# Patient Record
Sex: Female | Born: 1950 | Race: White | Hispanic: No | Smoking: Current every day smoker
Health system: Southern US, Community
[De-identification: ages and names within clinical notes are randomized; demographics above are authoritative.]

## PROBLEM LIST (undated history)

## (undated) DIAGNOSIS — M1990 Unspecified osteoarthritis, unspecified site: Secondary | ICD-10-CM

## (undated) DIAGNOSIS — K649 Unspecified hemorrhoids: Secondary | ICD-10-CM

## (undated) DIAGNOSIS — K219 Gastro-esophageal reflux disease without esophagitis: Secondary | ICD-10-CM

## (undated) DIAGNOSIS — B029 Zoster without complications: Secondary | ICD-10-CM

## (undated) DIAGNOSIS — M810 Age-related osteoporosis without current pathological fracture: Secondary | ICD-10-CM

## (undated) DIAGNOSIS — Z972 Presence of dental prosthetic device (complete) (partial): Secondary | ICD-10-CM

## (undated) DIAGNOSIS — L309 Dermatitis, unspecified: Secondary | ICD-10-CM

## (undated) DIAGNOSIS — F329 Major depressive disorder, single episode, unspecified: Secondary | ICD-10-CM

## (undated) DIAGNOSIS — F32A Depression, unspecified: Secondary | ICD-10-CM

## (undated) HISTORY — PX: THROAT SURGERY: SHX803

## (undated) HISTORY — PX: ABDOMINAL HYSTERECTOMY: SHX81

## (undated) HISTORY — PX: CHOLECYSTECTOMY: SHX55

## (undated) HISTORY — PX: KNEE SURGERY: SHX244

## (undated) HISTORY — PX: OTHER SURGICAL HISTORY: SHX169

## (undated) HISTORY — PX: TONSILLECTOMY: SUR1361

## (undated) HISTORY — PX: JOINT REPLACEMENT: SHX530

---

## 1979-01-19 HISTORY — PX: ABDOMINAL HYSTERECTOMY: SHX81

## 2006-07-27 ENCOUNTER — Ambulatory Visit: Payer: Self-pay

## 2007-08-29 ENCOUNTER — Ambulatory Visit: Payer: Self-pay

## 2007-08-31 ENCOUNTER — Ambulatory Visit: Payer: Self-pay

## 2008-03-06 ENCOUNTER — Ambulatory Visit: Payer: Self-pay

## 2013-07-27 ENCOUNTER — Ambulatory Visit: Payer: Self-pay | Admitting: Unknown Physician Specialty

## 2014-04-30 ENCOUNTER — Ambulatory Visit
Admit: 2014-04-30 | Disposition: A | Discharge status: Home / Self Care | Payer: Self-pay | Attending: Physician Assistant | Admitting: Physician Assistant

## 2014-09-13 ENCOUNTER — Other Ambulatory Visit: Payer: Self-pay | Admitting: Internal Medicine

## 2014-09-13 DIAGNOSIS — Z1231 Encounter for screening mammogram for malignant neoplasm of breast: Secondary | ICD-10-CM

## 2014-09-26 ENCOUNTER — Ambulatory Visit: Payer: Self-pay

## 2014-11-14 ENCOUNTER — Encounter: Payer: Self-pay | Admitting: *Deleted

## 2014-11-15 ENCOUNTER — Ambulatory Visit: Payer: No Typology Code available for payment source | Admitting: Certified Registered Nurse Anesthetist

## 2014-11-15 ENCOUNTER — Encounter: Payer: Self-pay | Admitting: *Deleted

## 2014-11-15 ENCOUNTER — Encounter
Admission: RE | Disposition: A | Discharge status: Home / Self Care | Payer: Self-pay | Source: Ambulatory Visit | Attending: Gastroenterology

## 2014-11-15 ENCOUNTER — Ambulatory Visit
Admission: RE | Admit: 2014-11-15 | Discharge: 2014-11-15 | Disposition: A | Discharge status: Home / Self Care | Payer: No Typology Code available for payment source | Source: Ambulatory Visit | Attending: Gastroenterology | Admitting: Gastroenterology

## 2014-11-15 DIAGNOSIS — Z79899 Other long term (current) drug therapy: Secondary | ICD-10-CM | POA: Diagnosis not present

## 2014-11-15 DIAGNOSIS — D120 Benign neoplasm of cecum: Secondary | ICD-10-CM | POA: Diagnosis not present

## 2014-11-15 DIAGNOSIS — D123 Benign neoplasm of transverse colon: Secondary | ICD-10-CM | POA: Diagnosis not present

## 2014-11-15 DIAGNOSIS — Z96651 Presence of right artificial knee joint: Secondary | ICD-10-CM | POA: Insufficient documentation

## 2014-11-15 DIAGNOSIS — F329 Major depressive disorder, single episode, unspecified: Secondary | ICD-10-CM | POA: Diagnosis not present

## 2014-11-15 DIAGNOSIS — M1990 Unspecified osteoarthritis, unspecified site: Secondary | ICD-10-CM | POA: Diagnosis not present

## 2014-11-15 DIAGNOSIS — Z9049 Acquired absence of other specified parts of digestive tract: Secondary | ICD-10-CM | POA: Diagnosis not present

## 2014-11-15 DIAGNOSIS — Z90710 Acquired absence of both cervix and uterus: Secondary | ICD-10-CM | POA: Diagnosis not present

## 2014-11-15 DIAGNOSIS — Z8249 Family history of ischemic heart disease and other diseases of the circulatory system: Secondary | ICD-10-CM | POA: Diagnosis not present

## 2014-11-15 DIAGNOSIS — Z823 Family history of stroke: Secondary | ICD-10-CM | POA: Diagnosis not present

## 2014-11-15 DIAGNOSIS — K644 Residual hemorrhoidal skin tags: Secondary | ICD-10-CM | POA: Diagnosis not present

## 2014-11-15 DIAGNOSIS — F17210 Nicotine dependence, cigarettes, uncomplicated: Secondary | ICD-10-CM | POA: Insufficient documentation

## 2014-11-15 DIAGNOSIS — Z833 Family history of diabetes mellitus: Secondary | ICD-10-CM | POA: Diagnosis not present

## 2014-11-15 DIAGNOSIS — D125 Benign neoplasm of sigmoid colon: Secondary | ICD-10-CM | POA: Insufficient documentation

## 2014-11-15 DIAGNOSIS — Z8371 Family history of colonic polyps: Secondary | ICD-10-CM | POA: Insufficient documentation

## 2014-11-15 DIAGNOSIS — K5730 Diverticulosis of large intestine without perforation or abscess without bleeding: Secondary | ICD-10-CM | POA: Diagnosis not present

## 2014-11-15 DIAGNOSIS — Z86010 Personal history of colonic polyps: Secondary | ICD-10-CM | POA: Insufficient documentation

## 2014-11-15 HISTORY — DX: Zoster without complications: B02.9

## 2014-11-15 HISTORY — DX: Unspecified osteoarthritis, unspecified site: M19.90

## 2014-11-15 HISTORY — DX: Depression, unspecified: F32.A

## 2014-11-15 HISTORY — DX: Unspecified hemorrhoids: K64.9

## 2014-11-15 HISTORY — DX: Dermatitis, unspecified: L30.9

## 2014-11-15 HISTORY — DX: Major depressive disorder, single episode, unspecified: F32.9

## 2014-11-15 HISTORY — PX: COLONOSCOPY WITH PROPOFOL: SHX5780

## 2014-11-15 SURGERY — COLONOSCOPY WITH PROPOFOL
Anesthesia: General

## 2014-11-15 MED ORDER — PROPOFOL 10 MG/ML IV BOLUS
INTRAVENOUS | Status: DC | PRN
  Administered 2014-11-15: 20 mg via INTRAVENOUS
  Administered 2014-11-15: 50 mg via INTRAVENOUS
  Administered 2014-11-15: 20 mg via INTRAVENOUS

## 2014-11-15 MED ORDER — SODIUM CHLORIDE 0.9 % IV SOLN
INTRAVENOUS | Status: DC
  Administered 2014-11-15: 11:00:00 via INTRAVENOUS

## 2014-11-15 MED ORDER — PROPOFOL 500 MG/50ML IV EMUL
INTRAVENOUS | Status: DC | PRN
  Administered 2014-11-15: 140 ug/kg/min via INTRAVENOUS

## 2014-11-15 MED ORDER — LIDOCAINE HCL (CARDIAC) 20 MG/ML IV SOLN
INTRAVENOUS | Status: DC | PRN
  Administered 2014-11-15: 40 mg via INTRAVENOUS

## 2014-11-15 NOTE — Anesthesia Postprocedure Evaluation (Signed)
  Anesthesia Post-op Note  Patient: Heidi Barrera  Procedure(s) Performed: Procedure(s): COLONOSCOPY WITH PROPOFOL (N/A)  Anesthesia type:General  Patient location: PACU  Post pain: Pain level controlled  Post assessment: Post-op Vital signs reviewed, Patient's Cardiovascular Status Stable, Respiratory Function Stable, Patent Airway and No signs of Nausea or vomiting  Post vital signs: Reviewed and stable  Last Vitals:  Filed Vitals:   11/15/14 1300  BP: 127/65  Pulse: 72  Temp:   Resp: 22    Level of consciousness: awake, alert  and patient cooperative  Complications: No apparent anesthesia complications

## 2014-11-15 NOTE — Anesthesia Preprocedure Evaluation (Signed)
Anesthesia Evaluation  Patient identified by MRN, date of birth, ID band Patient awake    Reviewed: Allergy & Precautions, H&P , NPO status , Patient's Chart, lab work & pertinent test results  History of Anesthesia Complications Negative for: history of anesthetic complications  Airway Mallampati: II  TM Distance: >3 FB Neck ROM: full    Dental  (+) Poor Dentition, Missing, Upper Dentures   Pulmonary neg shortness of breath, COPD, Current Smoker,    Pulmonary exam normal breath sounds clear to auscultation       Cardiovascular Exercise Tolerance: Good (-) angina(-) Past MI and (-) DOE negative cardio ROS Normal cardiovascular exam Rhythm:regular Rate:Normal     Neuro/Psych PSYCHIATRIC DISORDERS Depression negative neurological ROS     GI/Hepatic negative GI ROS, Neg liver ROS,   Endo/Other  negative endocrine ROS  Renal/GU negative Renal ROS  negative genitourinary   Musculoskeletal  (+) Arthritis ,   Abdominal   Peds  Hematology negative hematology ROS (+)   Anesthesia Other Findings Past Medical History:   Eczema                                                       Shingles                                                     Depression                                                   Arthritis                                                    Hemorrhoids                                                 Past Surgical History:   TONSILLECTOMY                                                 CHOLECYSTECTOMY                                               ABDOMINAL HYSTERECTOMY                                        KNEE SURGERY  THROAT SURGERY                                                Excision of Loose Body Knee                     Right              JOINT REPLACEMENT                                               Comment:RT TKR  BMI    Body Mass Index    21.53 kg/m 2      Reproductive/Obstetrics negative OB ROS                             Anesthesia Physical Anesthesia Plan  ASA: III  Anesthesia Plan: General   Post-op Pain Management:    Induction:   Airway Management Planned:   Additional Equipment:   Intra-op Plan:   Post-operative Plan:   Informed Consent: I have reviewed the patients History and Physical, chart, labs and discussed the procedure including the risks, benefits and alternatives for the proposed anesthesia with the patient or authorized representative who has indicated his/her understanding and acceptance.   Dental Advisory Given  Plan Discussed with: Anesthesiologist, CRNA and Surgeon  Anesthesia Plan Comments:         Anesthesia Quick Evaluation

## 2014-11-15 NOTE — Anesthesia Procedure Notes (Signed)
Performed by: Lela Gell Pre-anesthesia Checklist: Patient identified, Emergency Drugs available, Suction available, Timeout performed and Patient being monitored Patient Re-evaluated:Patient Re-evaluated prior to inductionOxygen Delivery Method: Nasal cannula Intubation Type: IV induction       

## 2014-11-15 NOTE — Transfer of Care (Signed)
Immediate Anesthesia Transfer of Care Note  Patient: Heidi Barrera  Procedure(s) Performed: Procedure(s): COLONOSCOPY WITH PROPOFOL (N/A)  Patient Location: PACU  Anesthesia Type:General  Level of Consciousness: awake, alert  and oriented  Airway & Oxygen Therapy: Patient Spontanous Breathing and Patient connected to nasal cannula oxygen  Post-op Assessment: Report given to RN and Post -op Vital signs reviewed and stable  Post vital signs: Reviewed and stable  Last Vitals:  Filed Vitals:   11/15/14 1116  BP: 118/59  Pulse: 87  Temp: 36.7 C  Resp: 18    Complications: No apparent anesthesia complications

## 2014-11-15 NOTE — H&P (Signed)
  Primary Care Physician:  Adin Hector, MD  Pre-Procedure History & Physical: HPI:  Heidi Barrera is a 64 y.o. female is here for an colonoscopy.   Past Medical History  Diagnosis Date  . Eczema   . Shingles   . Depression   . Arthritis   . Hemorrhoids     Past Surgical History  Procedure Laterality Date  . Tonsillectomy    . Cholecystectomy    . Abdominal hysterectomy    . Knee surgery    . Throat surgery    . Excision of loose body knee Right   . Joint replacement      RT TKR    Prior to Admission medications   Medication Sig Start Date End Date Taking? Authorizing Provider  clonazePAM (KLONOPIN) 0.5 MG tablet Take 0.5 mg by mouth at bedtime. 1/2 to 1 tablet daily as needed for anxiety/sleep   Yes Historical Provider, MD  dicyclomine (BENTYL) 10 MG capsule Take 10 mg by mouth 4 (four) times daily -  before meals and at bedtime.   Yes Historical Provider, MD  ibuprofen (ADVIL,MOTRIN) 200 MG tablet Take 200 mg by mouth every 6 (six) hours as needed.   Yes Historical Provider, MD  sertraline (ZOLOFT) 100 MG tablet Take 100 mg by mouth daily.   Yes Historical Provider, MD    Allergies as of 10/22/2014  . (Not on File)    History reviewed. No pertinent family history.  Social History   Social History  . Marital Status: Widowed    Spouse Name: N/A  . Number of Children: N/A  . Years of Education: N/A   Occupational History  . Not on file.   Social History Main Topics  . Smoking status: Current Every Day Smoker -- 0.50 packs/day for 40 years    Types: Cigarettes  . Smokeless tobacco: Never Used  . Alcohol Use: No  . Drug Use: No  . Sexual Activity: Not on file   Other Topics Concern  . Not on file   Social History Narrative     Physical Exam: BP 118/59 mmHg  Pulse 87  Temp(Src) 98.1 F (36.7 C) (Tympanic)  Resp 18  Ht 4\' 8"  (1.422 m)  Wt 43.545 kg (96 lb)  BMI 21.53 kg/m2  SpO2 99% General:   Alert,  pleasant and cooperative in NAD Head:   Normocephalic and atraumatic. Neck:  Supple; no masses or thyromegaly. Lungs:  Clear throughout to auscultation.    Heart:  Regular rate and rhythm. Abdomen:  Soft, nontender and nondistended. Normal bowel sounds, without guarding, and without rebound.   Neurologic:  Alert and  oriented x4;  grossly normal neurologically.  Impression/Plan: Heidi Barrera is here for an colonoscopy to be performed for personal hx adenomas, last colon 2013  Risks, benefits, limitations, and alternatives regarding  colonoscopy have been reviewed with the patient.  Questions have been answered.  All parties agreeable.   Josefine Class, MD  11/15/2014, 11:44 AM

## 2014-11-15 NOTE — Discharge Instructions (Signed)

## 2014-11-15 NOTE — Op Note (Signed)
[facility] Patient Name: Heidi Barrera Procedure Date: 11/15/2014 11:42 AM MRN: [id number] Account #: [id number] Date of Birth: 05-14-1950 Admit Type: Outpatient Age: 64 Room: [facility] ENDO ROOM 2 Gender: Female Note Status: Finalized Procedure:         Colonoscopy Indications:       Surveillance: Personal history of adenomatous polyps on                     last colonoscopy 3 years ago Patient Profile:   This is a 64 year old female. Providers:         Gerrit Heck. Rayann Heman, MD Referring MD:      Ramonita Lab, MD (Referring MD) Medicines:         Propofol per Anesthesia Complications:     No immediate complications. Procedure:         Pre-Anesthesia Assessment:                    - Prior to the procedure, a History and Physical was                     performed, and patient medications, allergies and                     sensitivities were reviewed. The patient's tolerance of                     previous anesthesia was reviewed.                    After obtaining informed consent, the colonoscope was                     passed under direct vision. Throughout the procedure, the                     patient's blood pressure, pulse, and oxygen saturations                     were monitored continuously. The Colonoscope was                     introduced through the anus and advanced to the the                     terminal ileum. The colonoscopy was performed without                     difficulty. The patient tolerated the procedure well. The                     quality of the bowel preparation was good. Findings:      The perianal exam findings include non-thrombosed external hemorrhoids.      A 4 mm polyp was found at the hepatic flexure. The polyp was sessile.       The polyp was removed with a cold snare. Resection and retrieval were       complete.      A 3 mm polyp was found in the cecum. The polyp was sessile. The polyp       was removed with a jumbo  cold forceps. Resection and retrieval were       complete.      A 3 mm polyp was found in the transverse colon. The polyp was sessile.  The polyp was removed with a jumbo cold forceps. Resection and retrieval       were complete.      A 3 mm polyp was found in the sigmoid colon. The polyp was sessile. The       polyp was removed with a jumbo cold forceps. Resection and retrieval       were complete.      A few small-mouthed diverticula were found in the sigmoid colon.      Internal hemorrhoids were found during retroflexion. The hemorrhoids       were Grade I (internal hemorrhoids that do not prolapse).      The exam was otherwise without abnormality. Impression:        - Non-thrombosed external hemorrhoids found on perianal                     exam.                    - One 4 mm polyp at the hepatic flexure. Resected and                     retrieved.                    - One 3 mm polyp in the cecum. Resected and retrieved.                    - One 3 mm polyp in the transverse colon. Resected and                     retrieved.                    - One 3 mm polyp in the sigmoid colon. Resected and                     retrieved.                    - Diverticulosis in the sigmoid colon.                    - Internal hemorrhoids.                    - The examination was otherwise normal. Recommendation:    - Observe patient in GI recovery unit.                    - High fiber diet.                    - Continue present medications.                    - Await pathology results.                    - Repeat colonoscopy for surveillance based on pathology                     results, likely in 3 years                    - Return to referring physician.                    - The findings and recommendations were discussed with the  patient.                    - The findings and recommendations were discussed with the                     patient's family. Procedure  Code(s): --- Professional ---                    [phone], Colonoscopy, flexible; with removal of tumor(s),                     polyp(s), or other lesion(s) by snare technique                    75916, 86, Colonoscopy, flexible; with biopsy, single or                     multiple CPT copyright 2014 American Medical Association. All rights reserved. The codes documented in this report are preliminary and upon coder review may  be revised to meet current compliance requirements. Mellody Life, MD 11/15/2014 12:23:56 PM This report has been signed electronically. Number of Addenda: 0 Note Initiated On: 11/15/2014 11:42 AM Scope Withdrawal Time: 0 hours 16 minutes 17 seconds  Total Procedure Duration: 0 hours 22 minutes 55 seconds       [facility]

## 2014-11-17 ENCOUNTER — Encounter: Payer: Self-pay | Admitting: Gastroenterology

## 2014-11-18 LAB — SURGICAL PATHOLOGY

## 2015-04-23 ENCOUNTER — Ambulatory Visit
Admission: RE | Admit: 2015-04-23 | Discharge: 2015-04-23 | Disposition: A | Discharge status: Home / Self Care | Payer: BLUE CROSS/BLUE SHIELD | Source: Ambulatory Visit | Attending: Family Medicine | Admitting: Family Medicine

## 2015-04-23 ENCOUNTER — Other Ambulatory Visit: Payer: Self-pay | Admitting: Family Medicine

## 2015-04-23 ENCOUNTER — Encounter: Payer: Self-pay | Admitting: Family Medicine

## 2015-04-23 ENCOUNTER — Inpatient Hospital Stay: Payer: BLUE CROSS/BLUE SHIELD | Attending: Family Medicine | Admitting: Family Medicine

## 2015-04-23 DIAGNOSIS — F17210 Nicotine dependence, cigarettes, uncomplicated: Secondary | ICD-10-CM | POA: Diagnosis not present

## 2015-04-23 DIAGNOSIS — Z87891 Personal history of nicotine dependence: Secondary | ICD-10-CM | POA: Diagnosis present

## 2015-04-23 DIAGNOSIS — Z122 Encounter for screening for malignant neoplasm of respiratory organs: Secondary | ICD-10-CM

## 2015-04-23 DIAGNOSIS — R932 Abnormal findings on diagnostic imaging of liver and biliary tract: Secondary | ICD-10-CM | POA: Diagnosis not present

## 2015-04-23 DIAGNOSIS — I700 Atherosclerosis of aorta: Secondary | ICD-10-CM | POA: Insufficient documentation

## 2015-04-23 DIAGNOSIS — J439 Emphysema, unspecified: Secondary | ICD-10-CM | POA: Insufficient documentation

## 2015-04-23 DIAGNOSIS — R918 Other nonspecific abnormal finding of lung field: Secondary | ICD-10-CM | POA: Insufficient documentation

## 2015-04-23 NOTE — Progress Notes (Signed)
In accordance with CMS guidelines, patient has meet eligibility criteria including age, absence of signs or symptoms of lung cancer, the specific calculation of cigarette smoking pack-years was 37.5 years and is a current smoker.   A shared decision-making session was conducted prior to the performance of CT scan. This includes one or more decision aids, includes benefits and harms of screening, follow-up diagnostic testing, over-diagnosis, false positive rate, and total radiation exposure.  Counseling on the importance of adherence to annual lung cancer LDCT screening, impact of co-morbidities, and ability or willingness to undergo diagnosis and treatment is imperative for compliance of the program.  Counseling on the importance of continued smoking cessation for former smokers; the importance of smoking cessation for current smokers and information about tobacco cessation interventions have been given to patient including the [place] at [facility], [address] quit [place], as well as [place] specific smoking cessation programs.  Written order for lung cancer screening with LDCT has been given to the patient and any and all questions have been answered to the best of my abilities.   Yearly follow up will be scheduled by Burgess Estelle, Thoracic Navigator.

## 2015-04-25 ENCOUNTER — Telehealth: Payer: Self-pay | Admitting: *Deleted

## 2015-04-25 NOTE — Telephone Encounter (Signed)
Notified patient of LDCT lung cancer screening results of Lung Rads 3S finding with recommendation for 12 month follow up imaging. Also notified of incidental finding noted below. Patient verbalizes understanding. Will forward to PCP and assist with follow up of liver finding as needed.  IMPRESSION: 1. Lung-Rads category 3s, probably benign findings. Short-term follow-up in 6 months is recommended with repeat low-dose chest CT without contrast (please use the following order, "CT CHEST LCS NODULE FOLLOW-UP W/O CM"). 2. The S modifier refers to a potentially clinically significant finding within the liver. There is an indeterminate, intermediate attenuating structure within the posterior dome measuring 5.6 cm. Further investigation with liver protocol MRI is advised. 3. Emphysema 4. Aortic atherosclerosis.

## 2015-05-08 ENCOUNTER — Other Ambulatory Visit: Payer: Self-pay | Admitting: Internal Medicine

## 2015-05-08 DIAGNOSIS — R160 Hepatomegaly, not elsewhere classified: Secondary | ICD-10-CM

## 2015-05-27 ENCOUNTER — Ambulatory Visit
Admission: RE | Admit: 2015-05-27 | Discharge: 2015-05-27 | Disposition: A | Discharge status: Home / Self Care | Payer: BLUE CROSS/BLUE SHIELD | Source: Ambulatory Visit | Attending: Internal Medicine | Admitting: Internal Medicine

## 2015-05-27 DIAGNOSIS — K7689 Other specified diseases of liver: Secondary | ICD-10-CM | POA: Diagnosis not present

## 2015-05-27 DIAGNOSIS — R160 Hepatomegaly, not elsewhere classified: Secondary | ICD-10-CM | POA: Insufficient documentation

## 2015-05-27 DIAGNOSIS — Z9049 Acquired absence of other specified parts of digestive tract: Secondary | ICD-10-CM | POA: Diagnosis not present

## 2015-05-27 DIAGNOSIS — D1803 Hemangioma of intra-abdominal structures: Secondary | ICD-10-CM | POA: Insufficient documentation

## 2015-05-27 MED ORDER — GADOBENATE DIMEGLUMINE 529 MG/ML IV SOLN
10.0000 mL | Freq: Once | INTRAVENOUS | Status: AC | PRN
  Administered 2015-05-27: 8 mL via INTRAVENOUS

## 2015-09-10 ENCOUNTER — Other Ambulatory Visit: Payer: Self-pay | Admitting: Internal Medicine

## 2015-09-10 DIAGNOSIS — R911 Solitary pulmonary nodule: Secondary | ICD-10-CM

## 2015-10-24 ENCOUNTER — Ambulatory Visit: Admission: RE | Admit: 2015-10-24 | Payer: BLUE CROSS/BLUE SHIELD | Source: Ambulatory Visit

## 2015-10-24 ENCOUNTER — Ambulatory Visit
Admission: RE | Admit: 2015-10-24 | Discharge: 2015-10-24 | Disposition: A | Discharge status: Home / Self Care | Payer: BLUE CROSS/BLUE SHIELD | Source: Ambulatory Visit | Attending: Internal Medicine | Admitting: Internal Medicine

## 2015-10-24 DIAGNOSIS — R911 Solitary pulmonary nodule: Secondary | ICD-10-CM

## 2015-10-24 DIAGNOSIS — I700 Atherosclerosis of aorta: Secondary | ICD-10-CM | POA: Diagnosis not present

## 2015-10-24 DIAGNOSIS — K769 Liver disease, unspecified: Secondary | ICD-10-CM | POA: Diagnosis not present

## 2015-10-29 ENCOUNTER — Telehealth: Payer: Self-pay | Admitting: *Deleted

## 2015-10-29 NOTE — Telephone Encounter (Signed)
Patient contacted me to review lung screening CT scan. Notified patient of LDCT lung cancer screening results with recommendation for 12 month follow up imaging. Also notified of incidental finding noted below. Patient verbalizes understanding.   IMPRESSION: 1. Lung-RADS Category 2, benign appearance or behavior. Continue annual screening with low-dose chest CT without contrast in 12 months. 2.  Aortic atherosclerosis (ICD10-170.0). 3. Liver lesions, characterized as benign on 05/27/2015.

## 2016-10-25 ENCOUNTER — Telehealth: Payer: Self-pay | Admitting: *Deleted

## 2016-10-25 DIAGNOSIS — Z122 Encounter for screening for malignant neoplasm of respiratory organs: Secondary | ICD-10-CM

## 2016-10-25 DIAGNOSIS — Z87891 Personal history of nicotine dependence: Secondary | ICD-10-CM

## 2016-10-25 NOTE — Telephone Encounter (Signed)
Notified patient that annual lung cancer screening low dose CT scan is due currently or will be in near future. Confirmed that patient is within the age range of 55-77, and asymptomatic, (no signs or symptoms of lung cancer). Patient denies illness that would prevent curative treatment for lung cancer if found. Verified smoking history, (current, 38 pack year). The shared decision making visit was done 04/23/15. Patient is agreeable for CT scan being scheduled.

## 2016-11-05 ENCOUNTER — Ambulatory Visit
Admission: RE | Admit: 2016-11-05 | Discharge: 2016-11-05 | Disposition: A | Discharge status: Home / Self Care | Payer: Medicare Other | Source: Ambulatory Visit | Attending: Nurse Practitioner | Admitting: Nurse Practitioner

## 2016-11-05 DIAGNOSIS — Z87891 Personal history of nicotine dependence: Secondary | ICD-10-CM | POA: Diagnosis not present

## 2016-11-05 DIAGNOSIS — R918 Other nonspecific abnormal finding of lung field: Secondary | ICD-10-CM | POA: Insufficient documentation

## 2016-11-05 DIAGNOSIS — I700 Atherosclerosis of aorta: Secondary | ICD-10-CM | POA: Diagnosis not present

## 2016-11-05 DIAGNOSIS — J432 Centrilobular emphysema: Secondary | ICD-10-CM | POA: Diagnosis not present

## 2016-11-05 DIAGNOSIS — D1803 Hemangioma of intra-abdominal structures: Secondary | ICD-10-CM | POA: Diagnosis not present

## 2016-11-05 DIAGNOSIS — Z122 Encounter for screening for malignant neoplasm of respiratory organs: Secondary | ICD-10-CM | POA: Diagnosis present

## 2016-11-08 ENCOUNTER — Encounter: Payer: Self-pay | Admitting: *Deleted

## 2017-10-29 ENCOUNTER — Telehealth: Payer: Self-pay

## 2017-10-29 NOTE — Telephone Encounter (Signed)
Call pt regarding lung screening. Pt is a current smoker , smoking about 2 pack per day. Scan can be any day except Oct. 28.  Would like scan to be in the [facility]

## 2017-10-31 ENCOUNTER — Telehealth: Payer: Self-pay | Admitting: *Deleted

## 2017-10-31 ENCOUNTER — Encounter: Payer: Self-pay | Admitting: *Deleted

## 2017-10-31 DIAGNOSIS — Z122 Encounter for screening for malignant neoplasm of respiratory organs: Secondary | ICD-10-CM

## 2017-10-31 NOTE — Telephone Encounter (Signed)
Patient has been notified that the annual lung cancer screening low dose CT scan is due currently or will be in the near future.  Confirmed that the patient is within the age range of 24-80, and asymptomatic, and currently exhibits no signs or symptoms of lung cancer.  Patient denies illness that would prevent curative treatment for lung cancer if found.  Verified smoking history, current smoker 0.5 ppd with 38pkyr history .  The shared decision making visit was completed on [id number] .  Patient is agreeable for the CT scan to be scheduled.  Will call patient back with date and time of appointment.

## 2017-11-08 ENCOUNTER — Telehealth: Payer: Self-pay | Admitting: *Deleted

## 2017-11-08 NOTE — Telephone Encounter (Signed)
Patient called back to verify her appt time, confirmed with patient.

## 2017-11-08 NOTE — Telephone Encounter (Signed)
Called patient to inform of ldct screening appt on 11-10-17@0805 , message left for patient unable to reach at this time.

## 2017-11-10 ENCOUNTER — Ambulatory Visit: Admission: RE | Admit: 2017-11-10 | Payer: Medicare Other | Source: Ambulatory Visit

## 2017-11-10 ENCOUNTER — Ambulatory Visit
Admission: RE | Admit: 2017-11-10 | Discharge: 2017-11-10 | Disposition: A | Discharge status: Home / Self Care | Payer: Medicare Other | Source: Ambulatory Visit | Attending: Oncology | Admitting: Oncology

## 2017-11-10 DIAGNOSIS — Z87891 Personal history of nicotine dependence: Secondary | ICD-10-CM | POA: Insufficient documentation

## 2017-11-10 DIAGNOSIS — I700 Atherosclerosis of aorta: Secondary | ICD-10-CM | POA: Insufficient documentation

## 2017-11-10 DIAGNOSIS — Z122 Encounter for screening for malignant neoplasm of respiratory organs: Secondary | ICD-10-CM | POA: Diagnosis present

## 2017-11-10 DIAGNOSIS — J439 Emphysema, unspecified: Secondary | ICD-10-CM | POA: Insufficient documentation

## 2017-11-11 ENCOUNTER — Encounter: Payer: Self-pay | Admitting: *Deleted

## 2018-03-15 ENCOUNTER — Other Ambulatory Visit: Payer: Self-pay

## 2018-03-15 ENCOUNTER — Encounter
Admission: RE | Admit: 2018-03-15 | Discharge: 2018-03-15 | Disposition: A | Discharge status: Home / Self Care | Payer: Medicare Other | Source: Ambulatory Visit | Attending: Surgery | Admitting: Surgery

## 2018-03-15 DIAGNOSIS — Z01818 Encounter for other preprocedural examination: Secondary | ICD-10-CM | POA: Insufficient documentation

## 2018-03-15 HISTORY — DX: Gastro-esophageal reflux disease without esophagitis: K21.9

## 2018-03-15 LAB — BASIC METABOLIC PANEL
Anion gap: 7 (ref 5–15)
BUN: 8 mg/dL (ref 8–23)
CO2: 27 mmol/L (ref 22–32)
Calcium: 9.3 mg/dL (ref 8.9–10.3)
Chloride: 106 mmol/L (ref 98–111)
Creatinine, Ser: 0.58 mg/dL (ref 0.44–1.00)
GFR calc Af Amer: >60 mL/min (ref >60)
GFR calc non Af Amer: >60 mL/min (ref >60)
Glucose, Bld: 97 mg/dL (ref 70–99)
Potassium: 3.9 mmol/L (ref 3.5–5.1)
Sodium: 140 mmol/L (ref 135–145)

## 2018-03-15 LAB — URINALYSIS, ROUTINE W REFLEX MICROSCOPIC
Bilirubin Urine: NEGATIVE
Glucose, UA: NEGATIVE mg/dL
Ketones, ur: NEGATIVE mg/dL
Leukocytes,Ua: NEGATIVE
Nitrite: NEGATIVE
Protein, ur: NEGATIVE mg/dL
Specific Gravity, Urine: 1.002 — ABNORMAL LOW (ref 1.005–1.030)
pH: 7 (ref 5.0–8.0)

## 2018-03-15 LAB — CBC
HEMATOCRIT: 41.9 % (ref 36.0–46.0)
Hemoglobin: 13.8 g/dL (ref 12.0–15.0)
MCH: 32.9 pg (ref 26.0–34.0)
MCHC: 32.9 g/dL (ref 30.0–36.0)
MCV: 99.8 fL (ref 80.0–100.0)
Platelets: 230 10*3/uL (ref 150–400)
RBC: 4.2 MIL/uL (ref 3.87–5.11)
RDW: 12.4 % (ref 11.5–15.5)
WBC: 7.2 10*3/uL (ref 4.0–10.5)
nRBC: 0 % (ref 0.0–0.2)

## 2018-03-15 LAB — TYPE AND SCREEN
ABO/RH(D): O POS
Antibody Screen: NEGATIVE

## 2018-03-15 LAB — SURGICAL PCR SCREEN
MRSA, PCR: NEGATIVE
Staphylococcus aureus: NEGATIVE

## 2018-03-15 NOTE — Patient Instructions (Signed)
  Your procedure is scheduled on: Thursday March 23, 2018 Report to Same Day Surgery 2nd floor [facility] Entrance-take elevator on left to 2nd floor.  Check in with surgery information desk.) To find out your arrival time, call [phone] 1:00-3:00 PM on Wednesday March 22, 2018  Remember: Instructions that are not followed completely may result in serious medical risk, up to and including death, or upon the discretion of your surgeon and anesthesiologist your surgery may need to be rescheduled.    __x__ 1. Do not eat food (including mints, candies, chewing gum) after midnight the night before your procedure. You may drink clear liquids up to 2 hours before you are scheduled to arrive at the hospital for your procedure.  Do not drink anything within 2 hours of your scheduled arrival to the hospital.  Approved clear liquids:  --Water or Apple juice without pulp  --Clear carbohydrate beverage such as Gatorade or Powerade  --Black Coffee or Clear Tea (No milk, no creamers, do not add anything to the coffee or tea)    __x__ 2. No Alcohol for 24 hours before or after surgery.   __x__ 3. No Smoking or e-cigarettes for 24 hours before surgery.  Do not use any chewable tobacco products for at least 6 hours before surgery.   __x__ 4. Notify your doctor if there is any change in your medical condition (cold, fever, infections).   __x__ 5. On the morning of surgery brush your teeth with toothpaste and water.  You may rinse your mouth with mouthwash if you wish.  Do not swallow any toothpaste or mouthwash.  Please read over the following fact sheets that you were given:   [facility] Preparing for Surgery and/or MRSA Information    __x__ Use CHG Soap or Sage wipes as directed on instruction sheet   Do not wear jewelry, make-up, hairpins, clips or nail polish on the day of surgery.  Do not wear lotions, powders, deodorant, or perfumes.   Do not shave below the face/neck 48 hours  prior to surgery.   Do not bring valuables to the hospital.    [facility] is not responsible for any belongings or valuables.               Contacts, dentures or bridgework may not be worn into surgery.  Leave your suitcase in the car. After surgery it may be brought to your room.  For patients admitted to the hospital, discharge time is determined by your treatment team.  __x__ Take these medicines on the morning of surgery with a small sip of water:  1. Pantoprazole (Protonix)  2. Sertraline (Zoloft)   Clonazepam and Tylenol if needed  ____ Follow recommendations from Cardiologist, Pulmonologist or PCP regarding stopping Aspirin, Coumadin, Plavix, Eliquis, Effient, Pradaxa, and Pletal.  __x__ TODAY: Stop Anti-inflammatories such as Advil, Ibuprofen, Motrin, Aleve, Naproxen, Naprosyn, BC/Goodies powders or aspirin products. You may continue to take Tylenol and Celebrex.   ____  Avoid supplements until after surgery. You may continue to take Vitamin D, Vitamin B, and multivitamin.

## 2018-03-16 LAB — URINE CULTURE: Culture: NO GROWTH

## 2018-03-16 NOTE — Pre-Procedure Instructions (Signed)
PCP CLEARED CKD/ANEMIA STABLE. LM FOR DR Lacinda Axon RE REPEAT LABS AM SURGERY SINCE CLEARED WITH LABS 03/14/18

## 2018-03-22 MED ORDER — CEFAZOLIN SODIUM-DEXTROSE 2-4 GM/100ML-% IV SOLN
2.0000 g | Freq: Once | INTRAVENOUS | Status: AC
  Administered 2018-03-23: 2 g via INTRAVENOUS

## 2018-03-23 ENCOUNTER — Other Ambulatory Visit: Payer: Self-pay

## 2018-03-23 ENCOUNTER — Inpatient Hospital Stay: Payer: Medicare Other

## 2018-03-23 ENCOUNTER — Inpatient Hospital Stay: Payer: Medicare Other | Admitting: Certified Registered"

## 2018-03-23 ENCOUNTER — Encounter
Admission: RE | Disposition: A | Discharge status: Home Health | Payer: Self-pay | Source: Home / Self Care | Attending: Surgery

## 2018-03-23 ENCOUNTER — Inpatient Hospital Stay
Admission: RE | Admit: 2018-03-23 | Discharge: 2018-03-25 | DRG: 470 | Disposition: A | Discharge status: Home Health | Payer: Medicare Other | Attending: Surgery | Admitting: Surgery

## 2018-03-23 DIAGNOSIS — M25752 Osteophyte, left hip: Secondary | ICD-10-CM | POA: Diagnosis present

## 2018-03-23 DIAGNOSIS — D62 Acute posthemorrhagic anemia: Secondary | ICD-10-CM | POA: Diagnosis not present

## 2018-03-23 DIAGNOSIS — Z823 Family history of stroke: Secondary | ICD-10-CM

## 2018-03-23 DIAGNOSIS — M5116 Intervertebral disc disorders with radiculopathy, lumbar region: Secondary | ICD-10-CM | POA: Diagnosis present

## 2018-03-23 DIAGNOSIS — Z833 Family history of diabetes mellitus: Secondary | ICD-10-CM | POA: Diagnosis not present

## 2018-03-23 DIAGNOSIS — Z79899 Other long term (current) drug therapy: Secondary | ICD-10-CM | POA: Diagnosis not present

## 2018-03-23 DIAGNOSIS — F17210 Nicotine dependence, cigarettes, uncomplicated: Secondary | ICD-10-CM | POA: Diagnosis present

## 2018-03-23 DIAGNOSIS — M25751 Osteophyte, right hip: Secondary | ICD-10-CM | POA: Diagnosis present

## 2018-03-23 DIAGNOSIS — J449 Chronic obstructive pulmonary disease, unspecified: Secondary | ICD-10-CM | POA: Diagnosis present

## 2018-03-23 DIAGNOSIS — K219 Gastro-esophageal reflux disease without esophagitis: Secondary | ICD-10-CM | POA: Diagnosis present

## 2018-03-23 DIAGNOSIS — Z96642 Presence of left artificial hip joint: Secondary | ICD-10-CM

## 2018-03-23 DIAGNOSIS — Z8249 Family history of ischemic heart disease and other diseases of the circulatory system: Secondary | ICD-10-CM | POA: Diagnosis not present

## 2018-03-23 DIAGNOSIS — Z7982 Long term (current) use of aspirin: Secondary | ICD-10-CM

## 2018-03-23 DIAGNOSIS — F329 Major depressive disorder, single episode, unspecified: Secondary | ICD-10-CM | POA: Diagnosis present

## 2018-03-23 DIAGNOSIS — M1612 Unilateral primary osteoarthritis, left hip: Principal | ICD-10-CM | POA: Diagnosis present

## 2018-03-23 DIAGNOSIS — M810 Age-related osteoporosis without current pathological fracture: Secondary | ICD-10-CM | POA: Diagnosis present

## 2018-03-23 DIAGNOSIS — Z85828 Personal history of other malignant neoplasm of skin: Secondary | ICD-10-CM

## 2018-03-23 DIAGNOSIS — M25552 Pain in left hip: Secondary | ICD-10-CM | POA: Diagnosis present

## 2018-03-23 HISTORY — PX: TOTAL HIP ARTHROPLASTY: SHX124

## 2018-03-23 LAB — ABO/RH: ABO/RH(D): O POS

## 2018-03-23 SURGERY — ARTHROPLASTY, HIP, TOTAL,POSTERIOR APPROACH
Anesthesia: Spinal | Laterality: Left

## 2018-03-23 MED ORDER — HYDROMORPHONE HCL 1 MG/ML IJ SOLN: 0.2500 mg | INTRAMUSCULAR | Status: DC | PRN

## 2018-03-23 MED ORDER — ASPIRIN EC 81 MG PO TBEC
81.0000 mg | DELAYED_RELEASE_TABLET | Freq: Every day | ORAL | Status: DC
  Administered 2018-03-24 – 2018-03-25 (×2): 81 mg via ORAL
  Filled 2018-03-23 (×2): qty 1

## 2018-03-23 MED ORDER — TRAMADOL HCL 50 MG PO TABS: 50.0000 mg | ORAL_TABLET | Freq: Four times a day (QID) | ORAL | 0 refills | Status: DC | PRN

## 2018-03-23 MED ORDER — KETOROLAC TROMETHAMINE 15 MG/ML IJ SOLN
INTRAMUSCULAR | Status: AC
  Administered 2018-03-23: 15 mg via INTRAVENOUS
  Filled 2018-03-23: qty 1

## 2018-03-23 MED ORDER — ENOXAPARIN SODIUM 40 MG/0.4ML ~~LOC~~ SOLN: 40.0000 mg | SUBCUTANEOUS | 0 refills | Status: DC

## 2018-03-23 MED ORDER — CEFAZOLIN SODIUM-DEXTROSE 2-4 GM/100ML-% IV SOLN
2.0000 g | Freq: Four times a day (QID) | INTRAVENOUS | Status: AC
  Administered 2018-03-23 – 2018-03-24 (×3): 2 g via INTRAVENOUS
  Filled 2018-03-23 (×3): qty 100

## 2018-03-23 MED ORDER — BISACODYL 10 MG RE SUPP
10.0000 mg | Freq: Every day | RECTAL | Status: DC | PRN
  Administered 2018-03-25: 10 mg via RECTAL
  Filled 2018-03-23: qty 1

## 2018-03-23 MED ORDER — MIDAZOLAM HCL 2 MG/2ML IJ SOLN
INTRAMUSCULAR | Status: AC
  Filled 2018-03-23: qty 2

## 2018-03-23 MED ORDER — CEFAZOLIN SODIUM-DEXTROSE 2-4 GM/100ML-% IV SOLN
INTRAVENOUS | Status: AC
  Filled 2018-03-23: qty 100

## 2018-03-23 MED ORDER — TRAMADOL HCL 50 MG PO TABS
50.0000 mg | ORAL_TABLET | Freq: Four times a day (QID) | ORAL | Status: DC | PRN
  Administered 2018-03-24 (×2): 50 mg via ORAL
  Filled 2018-03-23 (×2): qty 1

## 2018-03-23 MED ORDER — SODIUM CHLORIDE 0.9 % IV SOLN
INTRAVENOUS | Status: DC | PRN
  Administered 2018-03-23: 60 mL

## 2018-03-23 MED ORDER — METOCLOPRAMIDE HCL 10 MG PO TABS: 5.0000 mg | ORAL_TABLET | Freq: Three times a day (TID) | ORAL | Status: DC | PRN

## 2018-03-23 MED ORDER — DIPHENHYDRAMINE HCL 12.5 MG/5ML PO ELIX: 12.5000 mg | ORAL_SOLUTION | ORAL | Status: DC | PRN

## 2018-03-23 MED ORDER — TRANEXAMIC ACID 1000 MG/10ML IV SOLN
INTRAVENOUS | Status: AC
  Filled 2018-03-23: qty 10

## 2018-03-23 MED ORDER — SERTRALINE HCL 50 MG PO TABS
100.0000 mg | ORAL_TABLET | Freq: Every day | ORAL | Status: DC
  Administered 2018-03-23 – 2018-03-25 (×3): 100 mg via ORAL
  Filled 2018-03-23 (×3): qty 2

## 2018-03-23 MED ORDER — MAGNESIUM HYDROXIDE 400 MG/5ML PO SUSP
30.0000 mL | Freq: Every day | ORAL | Status: DC | PRN
  Administered 2018-03-24: 30 mL via ORAL
  Filled 2018-03-23: qty 30

## 2018-03-23 MED ORDER — GLYCOPYRROLATE 0.2 MG/ML IJ SOLN
INTRAMUSCULAR | Status: DC | PRN
  Administered 2018-03-23: 0.2 mg via INTRAVENOUS

## 2018-03-23 MED ORDER — ACETAMINOPHEN 325 MG PO TABS
325.0000 mg | ORAL_TABLET | Freq: Four times a day (QID) | ORAL | Status: DC | PRN
  Administered 2018-03-25: 650 mg via ORAL
  Filled 2018-03-23: qty 2

## 2018-03-23 MED ORDER — ONDANSETRON HCL 4 MG/2ML IJ SOLN
INTRAMUSCULAR | Status: DC | PRN
  Administered 2018-03-23: 4 mg via INTRAVENOUS

## 2018-03-23 MED ORDER — BUPIVACAINE HCL (PF) 0.5 % IJ SOLN
INTRAMUSCULAR | Status: AC
  Filled 2018-03-23: qty 30

## 2018-03-23 MED ORDER — FENTANYL CITRATE (PF) 100 MCG/2ML IJ SOLN
INTRAMUSCULAR | Status: AC
  Filled 2018-03-23: qty 2

## 2018-03-23 MED ORDER — DEXAMETHASONE SODIUM PHOSPHATE 10 MG/ML IJ SOLN
INTRAMUSCULAR | Status: DC | PRN
  Administered 2018-03-23: 10 mg via INTRAVENOUS

## 2018-03-23 MED ORDER — PROPOFOL 500 MG/50ML IV EMUL
INTRAVENOUS | Status: DC | PRN
  Administered 2018-03-23: 75 ug/kg/min via INTRAVENOUS

## 2018-03-23 MED ORDER — ACETAMINOPHEN 500 MG PO TABS
1000.0000 mg | ORAL_TABLET | Freq: Four times a day (QID) | ORAL | Status: AC
  Administered 2018-03-23 – 2018-03-24 (×4): 1000 mg via ORAL
  Filled 2018-03-23 (×4): qty 2

## 2018-03-23 MED ORDER — FAMOTIDINE 20 MG PO TABS
ORAL_TABLET | ORAL | Status: AC
  Administered 2018-03-23: 20 mg via ORAL
  Filled 2018-03-23: qty 1

## 2018-03-23 MED ORDER — BUPIVACAINE LIPOSOME 1.3 % IJ SUSP
INTRAMUSCULAR | Status: AC
  Filled 2018-03-23: qty 20

## 2018-03-23 MED ORDER — MIDAZOLAM HCL 2 MG/2ML IJ SOLN
INTRAMUSCULAR | Status: DC | PRN
  Administered 2018-03-23 (×2): 0.5 mg via INTRAVENOUS
  Administered 2018-03-23: 1 mg via INTRAVENOUS

## 2018-03-23 MED ORDER — SODIUM CHLORIDE FLUSH 0.9 % IV SOLN
INTRAVENOUS | Status: AC
  Filled 2018-03-23: qty 40

## 2018-03-23 MED ORDER — ONDANSETRON HCL 4 MG PO TABS: 4.0000 mg | ORAL_TABLET | Freq: Four times a day (QID) | ORAL | Status: DC | PRN

## 2018-03-23 MED ORDER — EPINEPHRINE PF 1 MG/ML IJ SOLN
INTRAMUSCULAR | Status: AC
  Filled 2018-03-23: qty 1

## 2018-03-23 MED ORDER — FENTANYL CITRATE (PF) 100 MCG/2ML IJ SOLN: 25.0000 ug | INTRAMUSCULAR | Status: DC | PRN

## 2018-03-23 MED ORDER — BUPIVACAINE-EPINEPHRINE (PF) 0.5% -1:200000 IJ SOLN
INTRAMUSCULAR | Status: DC | PRN
  Administered 2018-03-23: 30 mL

## 2018-03-23 MED ORDER — FAMOTIDINE 20 MG PO TABS
20.0000 mg | ORAL_TABLET | Freq: Once | ORAL | Status: AC
  Administered 2018-03-23: 20 mg via ORAL

## 2018-03-23 MED ORDER — ENOXAPARIN SODIUM 40 MG/0.4ML ~~LOC~~ SOLN
40.0000 mg | SUBCUTANEOUS | Status: DC
  Administered 2018-03-24 – 2018-03-25 (×2): 40 mg via SUBCUTANEOUS
  Filled 2018-03-23 (×2): qty 0.4

## 2018-03-23 MED ORDER — KETOROLAC TROMETHAMINE 15 MG/ML IJ SOLN
15.0000 mg | Freq: Once | INTRAMUSCULAR | Status: AC
  Administered 2018-03-23: 15 mg via INTRAVENOUS

## 2018-03-23 MED ORDER — OXYCODONE HCL 5 MG PO TABS: 5.0000 mg | ORAL_TABLET | ORAL | 0 refills | Status: DC | PRN

## 2018-03-23 MED ORDER — CLONAZEPAM 0.5 MG PO TABS
0.2500 mg | ORAL_TABLET | Freq: Every day | ORAL | Status: DC
  Administered 2018-03-23 – 2018-03-24 (×2): 0.5 mg via ORAL
  Filled 2018-03-23 (×2): qty 1

## 2018-03-23 MED ORDER — TRANEXAMIC ACID 1000 MG/10ML IV SOLN
INTRAVENOUS | Status: DC | PRN
  Administered 2018-03-23: 1000 mg via TOPICAL

## 2018-03-23 MED ORDER — ONDANSETRON HCL 4 MG/2ML IJ SOLN: 4.0000 mg | Freq: Once | INTRAMUSCULAR | Status: DC | PRN

## 2018-03-23 MED ORDER — SODIUM CHLORIDE 0.9 % IV SOLN
INTRAVENOUS | Status: DC | PRN
  Administered 2018-03-23: 25 ug/min via INTRAVENOUS

## 2018-03-23 MED ORDER — SODIUM CHLORIDE 0.9 % IV SOLN
INTRAVENOUS | Status: DC
  Administered 2018-03-23: 13:00:00 via INTRAVENOUS

## 2018-03-23 MED ORDER — LACTATED RINGERS IV SOLN
INTRAVENOUS | Status: DC
  Administered 2018-03-23 (×2): via INTRAVENOUS

## 2018-03-23 MED ORDER — DOCUSATE SODIUM 100 MG PO CAPS
100.0000 mg | ORAL_CAPSULE | Freq: Two times a day (BID) | ORAL | Status: DC
  Administered 2018-03-23 – 2018-03-24 (×3): 100 mg via ORAL
  Filled 2018-03-23 (×3): qty 1

## 2018-03-23 MED ORDER — PANTOPRAZOLE SODIUM 20 MG PO TBEC
20.0000 mg | DELAYED_RELEASE_TABLET | Freq: Every day | ORAL | Status: DC
  Administered 2018-03-24 – 2018-03-25 (×2): 20 mg via ORAL
  Filled 2018-03-23 (×3): qty 1

## 2018-03-23 MED ORDER — EPHEDRINE SULFATE 50 MG/ML IJ SOLN
INTRAMUSCULAR | Status: DC | PRN
  Administered 2018-03-23 (×5): 5 mg via INTRAVENOUS

## 2018-03-23 MED ORDER — ONDANSETRON HCL 4 MG/2ML IJ SOLN: 4.0000 mg | Freq: Four times a day (QID) | INTRAMUSCULAR | Status: DC | PRN

## 2018-03-23 MED ORDER — PROPOFOL 10 MG/ML IV BOLUS
INTRAVENOUS | Status: AC
  Filled 2018-03-23: qty 20

## 2018-03-23 MED ORDER — PROPOFOL 500 MG/50ML IV EMUL
INTRAVENOUS | Status: AC
  Filled 2018-03-23: qty 50

## 2018-03-23 MED ORDER — KETOROLAC TROMETHAMINE 15 MG/ML IJ SOLN
7.5000 mg | Freq: Four times a day (QID) | INTRAMUSCULAR | Status: AC
  Administered 2018-03-23 – 2018-03-24 (×3): 7.5 mg via INTRAVENOUS
  Filled 2018-03-23 (×3): qty 1

## 2018-03-23 MED ORDER — FLEET ENEMA 7-19 GM/118ML RE ENEM: 1.0000 | ENEMA | Freq: Once | RECTAL | Status: DC | PRN

## 2018-03-23 MED ORDER — OXYCODONE HCL 5 MG PO TABS
5.0000 mg | ORAL_TABLET | ORAL | Status: DC | PRN
  Administered 2018-03-23 – 2018-03-24 (×2): 5 mg via ORAL
  Filled 2018-03-23: qty 1
  Filled 2018-03-23: qty 2
  Filled 2018-03-23: qty 1

## 2018-03-23 MED ORDER — FENTANYL CITRATE (PF) 100 MCG/2ML IJ SOLN
INTRAMUSCULAR | Status: DC | PRN
  Administered 2018-03-23 (×2): 50 ug via INTRAVENOUS

## 2018-03-23 MED ORDER — METOCLOPRAMIDE HCL 5 MG/ML IJ SOLN: 5.0000 mg | Freq: Three times a day (TID) | INTRAMUSCULAR | Status: DC | PRN

## 2018-03-23 MED ORDER — COLESTIPOL HCL 1 G PO TABS
1.0000 g | ORAL_TABLET | Freq: Two times a day (BID) | ORAL | Status: DC
  Administered 2018-03-23 – 2018-03-24 (×2): 1 g via ORAL
  Filled 2018-03-23 (×6): qty 1

## 2018-03-23 SURGICAL SUPPLY — 57 items
BLADE SAGITTAL WIDE XTHICK NO (BLADE) ×3 IMPLANT
BLADE SURG SZ20 CARB STEEL (BLADE) ×3 IMPLANT
CANISTER SUCT 1200ML W/VALVE (MISCELLANEOUS) ×3 IMPLANT
CANISTER SUCT 3000ML PPV (MISCELLANEOUS) ×6 IMPLANT
CHLORAPREP W/TINT 26ML (MISCELLANEOUS) ×3 IMPLANT
COVER WAND RF STERILE (DRAPES) ×3 IMPLANT
DRAPE IMP U-DRAPE 54X76 (DRAPES) ×3 IMPLANT
DRAPE INCISE IOBAN 66X60 STRL (DRAPES) ×3 IMPLANT
DRAPE SHEET LG 3/4 BI-LAMINATE (DRAPES) ×3 IMPLANT
DRAPE SURG 17X11 SM STRL (DRAPES) ×6 IMPLANT
DRSG OPSITE POSTOP 4X10 (GAUZE/BANDAGES/DRESSINGS) ×3 IMPLANT
ELECT BLADE 6.5 EXT (BLADE) ×3 IMPLANT
ELECT CAUTERY BLADE 6.4 (BLADE) ×3 IMPLANT
GLOVE BIO SURGEON STRL SZ7.5 (GLOVE) ×12 IMPLANT
GLOVE BIO SURGEON STRL SZ8 (GLOVE) ×12 IMPLANT
GLOVE BIOGEL PI IND STRL 8 (GLOVE) ×1 IMPLANT
GLOVE BIOGEL PI INDICATOR 8 (GLOVE) ×2
GLOVE INDICATOR 8.0 STRL GRN (GLOVE) ×3 IMPLANT
GOWN STRL REUS W/ TWL LRG LVL3 (GOWN DISPOSABLE) ×1 IMPLANT
GOWN STRL REUS W/ TWL XL LVL3 (GOWN DISPOSABLE) ×1 IMPLANT
GOWN STRL REUS W/TWL LRG LVL3 (GOWN DISPOSABLE) ×2
GOWN STRL REUS W/TWL XL LVL3 (GOWN DISPOSABLE) ×2
HEAD FEM 32MM DIA 3MM NECK (Head) ×3 IMPLANT
HIP SHELL ACETAB 3H 46MM (Hips) ×3 IMPLANT
HOOD PEEL AWAY FLYTE STAYCOOL (MISCELLANEOUS) ×9 IMPLANT
KIT TURNOVER KIT A (KITS) ×3 IMPLANT
LINER ACETABULAR GY SZ32 B (Liner) ×3 IMPLANT
MAT ABSORB  FLUID 56X50 GRAY (MISCELLANEOUS) ×2
MAT ABSORB FLUID 56X50 GRAY (MISCELLANEOUS) ×1 IMPLANT
NDL SAFETY ECLIPSE 18X1.5 (NEEDLE) ×2 IMPLANT
NEEDLE FILTER BLUNT 18X 1/2SAF (NEEDLE) ×2
NEEDLE FILTER BLUNT 18X1 1/2 (NEEDLE) ×1 IMPLANT
NEEDLE HYPO 18GX1.5 SHARP (NEEDLE) ×4
NEEDLE SPNL 20GX3.5 QUINCKE YW (NEEDLE) ×3 IMPLANT
PACK HIP PROSTHESIS (MISCELLANEOUS) ×3 IMPLANT
PILLOW ABDUCTION FOAM SM (MISCELLANEOUS) ×3 IMPLANT
PIN STEINMAN 3/16 (PIN) ×3 IMPLANT
PIN STEINMANN 3/16X9 BAY 6PK (Pin) ×1 IMPLANT
PULSAVAC PLUS IRRIG FAN TIP (DISPOSABLE) ×3
SHELL ACETAB HIP 3H 46MM (Hips) ×1 IMPLANT
SOL .9 NS 3000ML IRR  AL (IV SOLUTION) ×2
SOL .9 NS 3000ML IRR UROMATIC (IV SOLUTION) ×1 IMPLANT
SPONGE LAP 18X18 RF (DISPOSABLE) ×3 IMPLANT
ST PIN 3/16X9 BAY 6PK (Pin) ×3 IMPLANT
STAPLER SKIN PROX 35W (STAPLE) ×3 IMPLANT
STEM FEM COLLARLESS 8X120X130 (Stem) ×3 IMPLANT
SUT TICRON 2-0 30IN 311381 (SUTURE) ×9 IMPLANT
SUT VIC AB 0 CT1 36 (SUTURE) ×3 IMPLANT
SUT VIC AB 1 CT1 36 (SUTURE) ×6 IMPLANT
SUT VIC AB 2-0 CT1 (SUTURE) ×12 IMPLANT
SYR 10ML LL (SYRINGE) ×3 IMPLANT
SYR 20CC LL (SYRINGE) ×3 IMPLANT
SYR 30ML LL (SYRINGE) ×9 IMPLANT
TAPE TRANSPORE STRL 2 31045 (GAUZE/BANDAGES/DRESSINGS) ×3 IMPLANT
TIP FAN IRRIG PULSAVAC PLUS (DISPOSABLE) ×1 IMPLANT
TRAY FOLEY MTR SLVR 16FR STAT (SET/KITS/TRAYS/PACK) ×3 IMPLANT
WATER STERILE IRR 1000ML POUR (IV SOLUTION) ×3 IMPLANT

## 2018-03-23 NOTE — Evaluation (Signed)
Physical Therapy Evaluation Patient Details Name: Heidi Barrera MRN: [id number] DOB: [birth year] Today's Date: 03/23/2018   History of Present Illness  Pt is a 68 yo female with DJD of the L hip and is s/p elective L THA.  PMH includes COPD, depression, GERD, and R knee surgery.     Clinical Impression  Pt presents with deficits in strength, transfers, mobility, gait, and activity tolerance but overall performed very well during the session especially considering POD#0 status.  Pt was Mod Ind with bed mobility tasks and CGA with transfers with cues for proper sequencing for posterior precaution compliance.  Pt was CGA with amb x 40' with practice making 90 deg turns to the L to avoid CKC L hip IR.  Pt and family present for extensive precaution education. Pt highly motivated to participate with PT services and should make excellent progress while in acute care.  Pt will benefit from HHPT services upon discharge to safely address above deficits for decreased caregiver assistance and eventual return to PLOF.        Follow Up Recommendations Home health PT    Equipment Recommendations  Rolling walker with 5" wheels;Other (comment)(Pt is 4'8", needs a youth FWW)    Recommendations for Other Services       Precautions / Restrictions Precautions Precautions: Posterior Hip;Fall Precaution Booklet Issued: Yes (comment) Precaution Comments: Precaution education and review provided with pt and daughters Restrictions Weight Bearing Restrictions: Yes LLE Weight Bearing: Weight bearing as tolerated      Mobility  Bed Mobility Overal bed mobility: Modified Independent             General bed mobility comments: Extra time and effort but no physical assistance needed with bed mobility tasks  Transfers Overall transfer level: Needs assistance Equipment used: Rolling walker (2 wheeled) Transfers: Sit to/from Stand Sit to Stand: From elevated surface;Min guard         General  transfer comment: Mod verbal cues for sequencing for hip precaution compliance  Ambulation/Gait Ambulation/Gait assistance: Min guard Gait Distance (Feet): 40 Feet Assistive device: Rolling walker (2 wheeled) Gait Pattern/deviations: Step-through pattern;Antalgic;Decreased step length - right;Decreased step length - left Gait velocity: decreased   General Gait Details: Slow cadence with mildly antalgic gait but steady without AD; 90 deg L turns x 4 to practice turns while avoiding closed kinetic chain L hip IR  Stairs            Wheelchair Mobility    Modified Rankin (Stroke Patients Only)       Balance Overall balance assessment: Needs assistance Sitting-balance support: Single extremity supported Sitting balance-Leahy Scale: Good     Standing balance support: Bilateral upper extremity supported Standing balance-Leahy Scale: Good                               Pertinent Vitals/Pain Pain Assessment: 0-10 Pain Score: 5  Pain Location: L hip Pain Descriptors / Indicators: Aching;Sore Pain Intervention(s): Limited activity within patient's tolerance;Premedicated before session;Monitored during session    [place] expects to be discharged to:: Private residence Living Arrangements: Children Available Help at Discharge: Family;Available 24 hours/day Type of Home: Mobile home Home Access: Stairs to enter Entrance Stairs-Rails: Right Entrance Stairs-Number of Steps: 5 Home Layout: One level Home Equipment: Bedside commode;Shower seat      Prior Function Level of Independence: Independent         Comments: Ind amb without AD limited  community distances, one fall in the last 6 months tripping on cord in a home, Ind with ADLs, cleans three houses for extra income     Hand Dominance        Extremity/Trunk Assessment   Upper Extremity Assessment Upper Extremity Assessment: Defer to OT evaluation    Lower Extremity  Assessment Lower Extremity Assessment: Generalized weakness;LLE deficits/detail LLE: Unable to fully assess due to pain LLE Sensation: WNL       Communication   Communication: No difficulties  Cognition Arousal/Alertness: Awake/alert Behavior During Therapy: WFL for tasks assessed/performed Overall Cognitive Status: Within Functional Limits for tasks assessed                                        General Comments      Exercises Total Joint Exercises Ankle Circles/Pumps: Both;10 reps;15 reps Quad Sets: Strengthening;Both;10 reps;15 reps Gluteal Sets: Strengthening;Both;10 reps Long Arc Quad: AROM;Both;10 reps;15 reps Knee Flexion: AROM;Both;10 reps;15 reps Marching in Standing: AROM;Both;10 reps;Standing Other Exercises Other Exercises: HEP education per handout Other Exercises: Posterior hip precaution education with pt and family verbally, handout review, and during bed mobility, transfers, and gait for functional application Other Exercises: Car transfer training sequencing with daughters present using a chair to simulate/demonstrate proper sequencing   Assessment/Plan    PT Assessment Patient needs continued PT services  PT Problem List Decreased strength;Decreased activity tolerance;Decreased mobility;Decreased knowledge of use of DME;Decreased knowledge of precautions       PT Treatment Interventions DME instruction;Gait training;Stair training;Functional mobility training;Therapeutic activities;Therapeutic exercise;Balance training;Patient/family education    PT Goals (Current goals can be found in the Care Plan section)  Acute Rehab PT Goals Patient Stated Goal: To walk and not hurt PT Goal Formulation: With patient Time For Goal Achievement: 04/05/18 Potential to Achieve Goals: Good    Frequency BID   Barriers to discharge        Co-evaluation               AM-PAC PT "6 Clicks" Mobility  Outcome Measure Help needed turning from  your back to your side while in a flat bed without using bedrails?: A Little Help needed moving from lying on your back to sitting on the side of a flat bed without using bedrails?: A Little Help needed moving to and from a bed to a chair (including a wheelchair)?: A Little Help needed standing up from a chair using your arms (e.g., wheelchair or bedside chair)?: A Little Help needed to walk in hospital room?: A Little Help needed climbing 3-5 steps with a railing? : A Little 6 Click Score: 18    End of Session Equipment Utilized During Treatment: Gait belt Activity Tolerance: Patient tolerated treatment well Patient left: in chair;with call bell/phone within reach;with chair alarm set;with family/visitor present;with nursing/sitter in room;with SCD's reapplied Nurse Communication: Mobility status PT Visit Diagnosis: Muscle weakness (generalized) (M62.81);Other abnormalities of gait and mobility (R26.89);Pain Pain - Right/Left: Left Pain - part of body: Hip    Time: 4270-6237 PT Time Calculation (min) (ACUTE ONLY): 54 min   Charges:   PT Evaluation $PT Eval Low Complexity: 1 Low PT Treatments $Gait Training: 8-22 mins $Therapeutic Exercise: 8-22 mins $Therapeutic Activity: 8-22 mins        D. Scott Yukio Bisping PT, DPT 03/23/18, 3:05 PM

## 2018-03-23 NOTE — Transfer of Care (Signed)
Immediate Anesthesia Transfer of Care Note  Patient: Heidi Barrera  Procedure(s) Performed: TOTAL HIP ARTHROPLASTY-LEFT (Left )  Patient Location: Endoscopy Unit  Anesthesia Type:General and Spinal  Level of Consciousness: awake, alert , oriented and patient cooperative  Airway & Oxygen Therapy: Patient Spontanous Breathing and Patient connected to face mask oxygen  Post-op Assessment: Report given to RN and Post -op Vital signs reviewed and stable  Post vital signs: Reviewed and stable  Last Vitals:  Vitals Value Taken Time  BP 122/74 03/23/2018  9:46 AM  Temp    Pulse 88 03/23/2018  9:49 AM  Resp 15 03/23/2018  9:49 AM  SpO2 99 % 03/23/2018  9:49 AM  Vitals shown include unvalidated device data.  Last Pain:  Vitals:   03/23/18 0637  TempSrc: Tympanic  PainSc: 6          Complications: No apparent anesthesia complications

## 2018-03-23 NOTE — Anesthesia Preprocedure Evaluation (Addendum)
Anesthesia Evaluation  Patient identified by MRN, date of birth, ID band Patient awake    Reviewed: Allergy & Precautions, H&P , NPO status , Patient's Chart, lab work & pertinent test results  History of Anesthesia Complications Negative for: history of anesthetic complications  Airway Mallampati: II  TM Distance: >3 FB Neck ROM: full    Dental  (+) Poor Dentition, Missing, Upper Dentures   Pulmonary neg shortness of breath, COPD, Current Smoker,    Pulmonary exam normal breath sounds clear to auscultation       Cardiovascular Exercise Tolerance: Good (-) angina(-) Past MI and (-) DOE negative cardio ROS Normal cardiovascular exam Rhythm:regular Rate:Normal     Neuro/Psych PSYCHIATRIC DISORDERS Depression negative neurological ROS     GI/Hepatic Neg liver ROS, GERD  ,  Endo/Other  negative endocrine ROS  Renal/GU negative Renal ROS  negative genitourinary   Musculoskeletal  (+) Arthritis ,   Abdominal   Peds  Hematology negative hematology ROS (+)   Anesthesia Other Findings Past Medical History:   Eczema                                                       Shingles                                                     Depression                                                   Arthritis                                                    Hemorrhoids                                                 Past Surgical History:   TONSILLECTOMY                                                 CHOLECYSTECTOMY                                               ABDOMINAL HYSTERECTOMY                                        KNEE SURGERY  THROAT SURGERY                                                Excision of Loose Body Knee                     Right              JOINT REPLACEMENT                                               Comment:RT TKR  BMI    Body Mass Index   21.53  kg/m 2      Reproductive/Obstetrics negative OB ROS                             Anesthesia Physical  Anesthesia Plan  ASA: III  Anesthesia Plan: Spinal   Post-op Pain Management:    Induction: Intravenous  PONV Risk Score and Plan:   Airway Management Planned: Nasal Cannula  Additional Equipment:   Intra-op Plan:   Post-operative Plan:   Informed Consent: I have reviewed the patients History and Physical, chart, labs and discussed the procedure including the risks, benefits and alternatives for the proposed anesthesia with the patient or authorized representative who has indicated his/her understanding and acceptance.     Dental Advisory Given  Plan Discussed with: Anesthesiologist, CRNA and Surgeon  Anesthesia Plan Comments:         Anesthesia Quick Evaluation

## 2018-03-23 NOTE — Op Note (Signed)
03/23/2018  9:47 AM  Patient:   Heidi Barrera  Pre-Op Diagnosis:   Degenerative joint disease, left hip.  Post-Op Diagnosis:   Same.  Procedure:   Left total hip arthroplasty.  Surgeon:   Pascal Lux, MD  Assistant:   Cameron Proud, PA-C; Orland Penman, PA-S  Anesthesia:   Spinal  Findings:   As above.  Complications:   None  EBL:   100 cc  Fluids:   1000 cc crystalloid  UOP:   250 cc  TT:   None  Drains:   None  Closure:   Staples  Implants:   Biomet press-fit system with a #8 laterally offset Echo femoral stem, a 46 mm acetabular shell with an E-poly hi-wall liner, and a 32 mm ceramic head with a -3 mm neck.  Brief Clinical Note:   The patient is a 68 year old female with a several year history of gradually worsening left hip pain. Her symptoms have progressed despite medications, activity modification, etc. Her history and examination consistent with degenerative joint disease confirmed by plain radiographs. The patient presents at this time for a left total hip arthroplasty.   Procedure:   The patient was brought into the operating room. After adequate spinal anesthesia was obtained, the patient was repositioned in the right lateral decubitus position and secured using a lateral hip positioner. The left hip and lower extremity were prepped with ChloroPrep solution before being draped sterilely. Preoperative antibiotics were administered. A timeout was performed to verify the appropriate surgical site before a standard posterior approach to the hip was made through an approximately 4-5 inch incision. The incision was carried down through the subcutaneous tissues to expose the gluteal fascia and proximal end of the iliotibial band. These structures were split the length of the incision and the Charnley self-retaining hip retractor placed. The bursal tissues were swept posteriorly to expose the short external rotators. The anterior border of the piriformis tendon was identified  and this plane developed down through the capsule to enter the joint. A flap of tissue was elevated off the posterior aspect of the femoral neck and greater trochanter and retracted posteriorly. This flap included the piriformis tendon, the short external rotators, and the posterior capsule. The soft tissues were elevated off the lateral aspect of the ilium and a large Steinmann pin placed bicortically. With the left leg aligned over the right, a drill bit was placed into the greater trochanter parallel to the Steinmann pin and the distance between these two pins measured in order to optimize leg lengths postoperatively. The drill bit was removed and the hip dislocated. The piriformis fossa was debrided of soft tissues before the intramedullary canal was accessed through this point using a triple step reamer. The canal was reamed sequentially beginning with a #7 tapered reamer and progressing to a #8 tapered reamer. This provided excellent circumferential chatter. Using the appropriate guide, a femoral neck cut was made 10-12 mm above the lesser trochanter. The femoral head was removed.  Attention was directed to the acetabular side. The labrum was debrided circumferentially before the ligamentum teres was removed using a large curette. A line was drawn on the drapes corresponding to the native version of the acetabulum. This line was used as a guide while the acetabulum was reamed sequentially beginning with a 41 mm reamer and progressing to a 45 mm reamer. This provided excellent circumferential chatter. The 45 mm trial acetabulum was positioned and found to fit quite well. Therefore, the 46 mm acetabular  shell was selected and impacted into place with care taken to maintain the appropriate version. The trial high wall liner was inserted.  Attention was redirected to the femoral side. A box osteotome was used to establish version before the canal was broached sequentially beginning with a #7 broach and  progressing to a #8 broach. This was left in place and several trial reductions performed using both a standard and laterally offset neck options, as well as the -6 mm and -3 mm neck lengths. The permanent E-polyethylene hi-wall liner was impacted into the acetabular shell and its locking mechanism verified using a quarter-inch osteotome. Next, the #8 lateral offset femoral stem was impacted into place with care taken to maintain the appropriate version. A repeat trial reduction was performed using the -6 mm and -3 mm neck lengths. The -3 mm neck length demonstrated excellent stability both in extension and external rotation as well as with flexion to 90 and internal rotation beyond 70. It also was stable in the position of sleep. In addition, leg lengths appeared to be restored appropriately, both by reassessing the position of the right leg over the left, as well as by measuring the distance between the Steinmann pin and the drill bit. The 32 mm ceramic head with the right is 3 mm neck was impacted onto the stem of the femoral component. The Morse taper locking mechanism was verified using manual distraction before the head was relocated and placed through a range of motion with the findings as described above.  The wound was copiously irrigated with bacitracin saline solution via the jet lavage system before the peri-incisional and pericapsular tissues were injected with 30 cc of 0.5% Sensorcaine with epinephrine and 20 cc of Exparel diluted out to 60 cc with normal saline to help with postoperative analgesia. The posterior flap was reapproximated to the posterior aspect of the greater trochanter using #2 Tycron interrupted sutures placed through drill holes. Several additional #2 Tycron interrupted sutures were used to reinforce this layer of closure. The iliotibial band was reapproximated using #1 Vicryl interrupted sutures before the gluteal fascia was closed using a running #1 Vicryl suture. At this  point, 1 g of transexemic acid in 10 cc of normal saline was injected into the joint to help reduce postoperative bleeding. The subcutaneous tissues were closed in several layers using 2-0 Vicryl interrupted sutures before the skin was closed using staples. A sterile occlusive dressing was applied to the wound before the patient was placed into an abduction wedge pillow. The patient was then rolled back into the supine position on his/her hospital bed before being awakened and returned to the recovery room in satisfactory condition after tolerating the procedure well.

## 2018-03-23 NOTE — Discharge Instructions (Signed)
Instructions after Total Hip Replacement     J. Jeffrey Poggi, M.D.  J. Lance Wendle Kina, PA-C     Dept. of Orthopaedics & Sports Medicine  [facility]  [address]  , [place]  [address]  Phone: [phone]   Fax: [phone]    DIET: . Drink plenty of non-alcoholic fluids. . Resume your normal diet. Include foods high in fiber.  ACTIVITY:  . You may use crutches or a walker with weight-bearing as tolerated, unless instructed otherwise. . You may be weaned off of the walker or crutches by your Physical Therapist.  . Do NOT reach below the level of your knees or cross your legs until allowed.    . Continue doing gentle exercises. Exercising will reduce the pain and swelling, increase motion, and prevent muscle weakness.   . Please continue to use the TED compression stockings for 6 weeks. You may remove the stockings at night, but should reapply them in the morning. . Do not drive or operate any equipment until instructed.  WOUND CARE:  . Continue to use ice packs periodically to reduce pain and swelling. . Keep the incision clean and dry. . You may bathe or shower after the staples are removed at the first office visit following surgery.  MEDICATIONS: . You may resume your regular medications. . Please take the pain medication as prescribed on the medication. . Do not take pain medication on an empty stomach. . You have been given a prescription for a blood thinner to prevent blood clots. Please take the medication as instructed. (NOTE: After completing a 2 week course of Lovenox, take one Enteric-coated aspirin once a day.) . Pain medications and iron supplements can cause constipation. Use a stool softener (Senokot or Colace) on a daily basis and a laxative (dulcolax or miralax) as needed. . Do not drive or drink alcoholic beverages when taking pain medications.  CALL THE OFFICE FOR: . Temperature above 101 degrees . Excessive bleeding or drainage on the  dressing. . Excessive swelling, coldness, or paleness of the toes. . Persistent nausea and vomiting.  FOLLOW-UP:  . You should have an appointment to return to the office in 2 weeks after surgery. . Arrangements have been made for continuation of Physical Therapy (either home therapy or outpatient therapy).  

## 2018-03-23 NOTE — Anesthesia Post-op Follow-up Note (Signed)
Anesthesia QCDR form completed.        

## 2018-03-23 NOTE — H&P (Signed)
Paper H&P to be scanned into permanent record. H&P reviewed and patient re-examined. No changes. 

## 2018-03-23 NOTE — Anesthesia Procedure Notes (Signed)
Spinal  Patient location during procedure: OR Start time: 03/23/2018 7:45 AM End time: 03/23/2018 7:47 AM Staffing Anesthesiologist: Alvin Critchley, MD Performed: anesthesiologist  Preanesthetic Checklist Completed: patient identified, site marked, surgical consent, pre-op evaluation, timeout performed, IV checked, risks and benefits discussed and monitors and equipment checked Spinal Block Patient position: sitting Prep: Betadine Patient monitoring: heart rate, continuous pulse ox, blood pressure and cardiac monitor Approach: midline Location: L4-5 Injection technique: single-shot Needle Needle type: Whitacre and Introducer  Needle gauge: 24 G Needle length: 9 cm Additional Notes Negative paresthesia. Negative blood return. Positive free-flowing CSF. Expiration date of kit checked and confirmed. Patient tolerated procedure well, without complications.

## 2018-03-23 NOTE — Care Management Note (Signed)
Case Management Note  Patient Details  Name: Heidi Barrera MRN: [id number] Date of Birth: 1950-10-24  Subjective/Objective:                   Met with the patient to discuss DC plan And needs The patient needs a youth rw Notified Brad thru secure Email of the need [facility] list provided to the patient per CMS.gov and the patient chooses [facility] for [facility] [place] Notified [facility] of the choice thru secure email, Pharmacy is [facility] [address] PCP is Dr. Caryl Comes Transportation is with family   Action/Plan: [facility] list provided to the patient per CMS.gov  Copy placed on the chart   Expected Discharge Date:                  Expected Discharge Plan:     In-House Referral:     Discharge planning Services  CM Consult  Post Acute Care Choice:  Home Health Choice offered to:  Patient  DME Arranged:  Walker rolling DME Agency:  [facility]  HH Arranged:  PT [place] Agency:  [place] [facility]  Status of Service:  In process, will continue to follow  If discussed at Long Length of Stay Meetings, dates discussed:    Additional Comments:  Su Hilt, RN 03/23/2018, 3:36 PM

## 2018-03-23 NOTE — Care Management (Signed)
Sent a inbox request to CMA requesting benefit check for Lovenox

## 2018-03-23 NOTE — Discharge Summary (Signed)
Physician Discharge Summary  Patient ID: Heidi Barrera MRN: [id number] DOB/AGE: 1950-11-04 68 y.o.  Admit date: 03/23/2018 Discharge date: 03/25/2018 Admission Diagnoses:  PRIMARY OSTEOARTHRITIS OF LEFT HIP  Discharge Diagnoses: Patient Active Problem List   Diagnosis Date Noted  . Status post total hip replacement, left 03/23/2018  . Personal history of tobacco use, presenting hazards to health 04/23/2015   Past Medical History:  Diagnosis Date  . Arthritis   . Depression   . Eczema   . GERD (gastroesophageal reflux disease)   . Hemorrhoids   . Shingles      Transfusion: None.   Consultants (if any):   Discharged Condition: Improved  Hospital Course: Heidi THOM is an 68 y.o. female who was admitted 03/23/2018 with a diagnosis of primary osteoarthritis of the left hip and went to the operating room on 03/23/2018 and underwent the above named procedures.    Surgeries: Procedure(s): TOTAL HIP ARTHROPLASTY-LEFT on 03/23/2018 Patient tolerated the surgery well. Taken to PACU where she was stabilized and then transferred to the orthopedic floor.  Started on Lovenox 93m q 24 hrs. Foot pumps applied bilaterally at 80 mm. Heels /elevated on bed /with rolled towels. No evidence of DVT. Negative Homan. Physical therapy started on day #1 for gait training and transfer. OT started day #1 for ADL and assisted devices.  Patient's IV was removed on POD2, Foley removed on POD1.  On postop day 2 patient met goals with physical therapy.  Patient stable and ready for discharge to home with home health PT.  Implants: Biomet press-fit system with a #8 laterally offset Echo femoral stem, a 46 mm acetabular shell with an E-poly hi-wall liner, and a 32 mm ceramic head with a -3 mm neck.  She was given perioperative antibiotics:  Anti-infectives (From admission, onward)   Start     Dose/Rate Route Frequency Ordered Stop   03/23/18 1300  ceFAZolin (ANCEF) IVPB 2g/100 mL premix     2 g 200  mL/hr over 30 Minutes Intravenous Every 6 hours 03/23/18 1054 03/24/18 0659   03/23/18 0629  ceFAZolin (ANCEF) 2-4 GM/100ML-% IVPB    Note to Pharmacy:  [place] cabinet override      03/23/18 0629 03/23/18 0736   03/22/18 2345  ceFAZolin (ANCEF) IVPB 2g/100 mL premix     2 g 200 mL/hr over 30 Minutes Intravenous  Once 03/22/18 2336 03/23/18 0736    .  She was given sequential compression devices, early ambulation, and Lovenox for DVT prophylaxis.  She benefited maximally from the hospital stay and there were no complications.    Recent vital signs:  Vitals:   03/23/18 1149 03/23/18 1321  BP: 112/63 117/61  Pulse: 67 88  Resp: 18 20  Temp: (!) 97.3 F (36.3 C) (!) 97.3 F (36.3 C)  SpO2: 100% 100%    Recent laboratory studies:  Lab Results  Component Value Date   HGB 13.8 03/15/2018   Lab Results  Component Value Date   WBC 7.2 03/15/2018   PLT 230 03/15/2018   No results found for: INR Lab Results  Component Value Date   NA 140 03/15/2018   K 3.9 03/15/2018   CL 106 03/15/2018   CO2 27 03/15/2018   BUN 8 03/15/2018   CREATININE 0.58 03/15/2018   GLUCOSE 97 03/15/2018    Discharge Medications:   Allergies as of 03/23/2018   No Known Allergies     Medication List    STOP taking these medications  aspirin EC 81 MG tablet     TAKE these medications   acetaminophen 500 MG tablet Commonly known as:  TYLENOL Take 1,000 mg by mouth every 6 (six) hours as needed for moderate pain.   clonazePAM 0.5 MG tablet Commonly known as:  KLONOPIN Take 0.25-0.5 mg by mouth at bedtime.   colestipol 1 g tablet Commonly known as:  COLESTID Take 1 g by mouth 2 (two) times daily.   enoxaparin 40 MG/0.4ML injection Commonly known as:  LOVENOX Inject 0.4 mLs (40 mg total) into the skin daily. Start taking on:  March 24, 2018   oxyCODONE 5 MG immediate release tablet Commonly known as:  Oxy IR/ROXICODONE Take 1-2 tablets (5-10 mg total) by mouth every 4  (four) hours as needed for moderate pain (pain score 4-6).   pantoprazole 20 MG tablet Commonly known as:  PROTONIX Take 20 mg by mouth daily.   sertraline 100 MG tablet Commonly known as:  ZOLOFT Take 100 mg by mouth daily.   traMADol 50 MG tablet Commonly known as:  ULTRAM Take 1 tablet (50 mg total) by mouth every 6 (six) hours as needed for moderate pain.            Durable Medical Equipment  (From admission, onward)         Start     Ordered   03/23/18 1055  DME Bedside commode  Once    Question:  Patient needs a bedside commode to treat with the following condition  Answer:  Status post total hip replacement, left   03/23/18 1054   03/23/18 1055  DME 3 n 1  Once     03/23/18 1054   03/23/18 1055  DME Walker rolling  Once    Question:  Patient needs a walker to treat with the following condition  Answer:  Status post total hip replacement, left   03/23/18 1054         Diagnostic Studies: Dg Hip Unilat W Or W/o Pelvis 2-3 Views Left  Result Date: 03/23/2018 CLINICAL DATA:  Left hip replacement EXAM: DG HIP (WITH OR WITHOUT PELVIS) 2-3V LEFT COMPARISON:  None. FINDINGS: Patient is status post bipolar left hip arthroplasty. Components appear aligned. Expected postoperative changes of the soft tissues. Visualized pelvis and right hip intact. Bones are osteopenic. IMPRESSION: Status post left hip arthroplasty. No complicating feature by plain radiography. Electronically Signed   By: Jerilynn Mages.  Shick M.D.   On: 03/23/2018 11:49   Disposition: Plan is for discharge home on 03/25/18 pending progress with physical therapy.  Follow-up Information    Heidi Corns, PA-C Follow up in 14 day(s).   Specialty:  Physician Assistant Why:  Heidi Barrera information: [place] [address] [phone]          Signed: Judson Roch PA-C 03/23/2018, 2:09 PM

## 2018-03-24 LAB — CBC WITH DIFFERENTIAL/PLATELET
Abs Immature Granulocytes: 0.03 10*3/uL (ref 0.00–0.07)
Basophils Absolute: 0 10*3/uL (ref 0.0–0.1)
Basophils Relative: 0 %
EOS ABS: 0 10*3/uL (ref 0.0–0.5)
Eosinophils Relative: 0 %
HCT: 29.9 % — ABNORMAL LOW (ref 36.0–46.0)
Hemoglobin: 9.6 g/dL — ABNORMAL LOW (ref 12.0–15.0)
Immature Granulocytes: 0 %
Lymphocytes Relative: 16 %
Lymphs Abs: 1.6 10*3/uL (ref 0.7–4.0)
MCH: 32.8 pg (ref 26.0–34.0)
MCHC: 32.1 g/dL (ref 30.0–36.0)
MCV: 102 fL — ABNORMAL HIGH (ref 80.0–100.0)
Monocytes Absolute: 1.1 10*3/uL — ABNORMAL HIGH (ref 0.1–1.0)
Monocytes Relative: 10 %
NRBC: 0 % (ref 0.0–0.2)
Neutro Abs: 7.4 10*3/uL (ref 1.7–7.7)
Neutrophils Relative %: 74 %
Platelets: 169 10*3/uL (ref 150–400)
RBC: 2.93 MIL/uL — ABNORMAL LOW (ref 3.87–5.11)
RDW: 12.6 % (ref 11.5–15.5)
WBC: 10.1 10*3/uL (ref 4.0–10.5)

## 2018-03-24 LAB — BASIC METABOLIC PANEL
Anion gap: 7 (ref 5–15)
BUN: 12 mg/dL (ref 8–23)
CALCIUM: 8.6 mg/dL — AB (ref 8.9–10.3)
CO2: 25 mmol/L (ref 22–32)
Chloride: 109 mmol/L (ref 98–111)
Creatinine, Ser: 0.6 mg/dL (ref 0.44–1.00)
GFR calc Af Amer: >60 mL/min (ref >60)
GFR calc non Af Amer: >60 mL/min (ref >60)
Glucose, Bld: 127 mg/dL — ABNORMAL HIGH (ref 70–99)
Potassium: 3.8 mmol/L (ref 3.5–5.1)
Sodium: 141 mmol/L (ref 135–145)

## 2018-03-24 MED ORDER — METHOCARBAMOL 500 MG PO TABS: 500.0000 mg | ORAL_TABLET | Freq: Three times a day (TID) | ORAL | 0 refills | Status: DC | PRN

## 2018-03-24 MED ORDER — METHOCARBAMOL 500 MG PO TABS
500.0000 mg | ORAL_TABLET | Freq: Three times a day (TID) | ORAL | Status: DC | PRN
  Administered 2018-03-24 – 2018-03-25 (×3): 500 mg via ORAL
  Filled 2018-03-24 (×3): qty 1

## 2018-03-24 MED ORDER — TRAMADOL HCL 50 MG PO TABS: 50.0000 mg | ORAL_TABLET | Freq: Four times a day (QID) | ORAL | 0 refills | Status: DC | PRN

## 2018-03-24 MED ORDER — METHOCARBAMOL 1000 MG/10ML IJ SOLN: 500.0000 mg | Freq: Three times a day (TID) | INTRAMUSCULAR | Status: DC

## 2018-03-24 MED ORDER — ENOXAPARIN SODIUM 40 MG/0.4ML ~~LOC~~ SOLN: 40.0000 mg | SUBCUTANEOUS | 0 refills | Status: DC

## 2018-03-24 MED ORDER — OXYCODONE HCL 5 MG PO TABS: 5.0000 mg | ORAL_TABLET | ORAL | 0 refills | Status: DC | PRN

## 2018-03-24 MED ORDER — FE FUMARATE-B12-VIT C-FA-IFC PO CAPS
1.0000 | ORAL_CAPSULE | Freq: Two times a day (BID) | ORAL | Status: DC
  Administered 2018-03-24 – 2018-03-25 (×3): 1 via ORAL
  Filled 2018-03-24 (×4): qty 1

## 2018-03-24 NOTE — Progress Notes (Signed)
Physical Therapy Treatment Patient Details Name: Heidi Barrera MRN: [id number] DOB: 1950-12-14 Today's Date: 03/24/2018    History of Present Illness Pt is a 68 yo female with DJD of the L hip was admitted to [facility] for elective L THA.  PMH includes COPD, depression, GERD, and R knee surgery.     PT Comments    Pt presents with deficits in strength, transfers, mobility, gait, balance, and activity tolerance but is making very good progress towards goals.  Pt was SBA with transfers this session with cues for sequencing for hip precaution compliance and was able to amb 125' with a RW with good cadence and stability with step-through pattern.  Pt will benefit from HHPT services upon discharge to safely address above deficits for decreased caregiver assistance and eventual return to PLOF.     Follow Up Recommendations  Home health PT     Equipment Recommendations  Rolling walker with 5" wheels;Other (comment)(Youth walker)    Recommendations for Other Services       Precautions / Restrictions Precautions Precautions: Posterior Hip;Fall Precaution Booklet Issued: Yes (comment) Precaution Comments: Precaution education and review provided with pt and daughter Restrictions Weight Bearing Restrictions: Yes LLE Weight Bearing: Weight bearing as tolerated    Mobility  Bed Mobility               General bed mobility comments: NT, pt in recliner  Transfers Overall transfer level: Needs assistance Equipment used: Rolling walker (2 wheeled) Transfers: Sit to/from Stand Sit to Stand: Supervision         General transfer comment: Mod verbal cues for sequencing for hip precaution compliance  Ambulation/Gait Ambulation/Gait assistance: Min guard Gait Distance (Feet): 125 Feet Assistive device: Rolling walker (2 wheeled) Gait Pattern/deviations: Step-through pattern;Antalgic;Decreased stance time - left;Decreased step length - right Gait velocity: decreased   General Gait  Details: Slow cadence with mildly antalgic gait but steady without LOB   Stairs             Wheelchair Mobility    Modified Rankin (Stroke Patients Only)       Balance Overall balance assessment: Needs assistance Sitting-balance support: Single extremity supported Sitting balance-Leahy Scale: Good     Standing balance support: No upper extremity supported Standing balance-Leahy Scale: Good Standing balance comment: Pt steady during static standing balance training with feet apart, together, and semi-tandem                            Cognition Arousal/Alertness: Awake/alert Behavior During Therapy: WFL for tasks assessed/performed Overall Cognitive Status: Within Functional Limits for tasks assessed                                        Exercises Total Joint Exercises Ankle Circles/Pumps: Both;10 reps;15 reps Quad Sets: Strengthening;Both;10 reps;15 reps Gluteal Sets: Strengthening;Both;10 reps Long Arc Quad: AROM;Both;10 reps;15 reps Knee Flexion: AROM;Both;10 reps;15 reps Marching in Standing: AROM;Both;10 reps;Standing Other Exercises Other Exercises: Static standing balance training without UE support with feet apart, together, and semi-tandem Other Exercises: Posterior hip precaution education with pt and family verbally and during transfers and gait for functional application    General Comments        Pertinent Vitals/Pain Pain Assessment: 0-10 Pain Score: 5  Pain Location: L hip Pain Descriptors / Indicators: Aching;Sore Pain Intervention(s): Monitored during session  Home Living Family/patient expects to be discharged to:: Private residence Living Arrangements: Children Available Help at Discharge: Family;Available 24 hours/day Type of Home: Mobile home Home Access: Stairs to enter Entrance Stairs-Rails: Right Home Layout: One level Home Equipment: Bedside commode;Shower seat;Walker - 2 wheels      Prior  Function Level of Independence: Independent          PT Goals (current goals can now be found in the care plan section) Acute Rehab PT Goals Patient Stated Goal: To regain independence Progress towards PT goals: Progressing toward goals    Frequency    BID      PT Plan Current plan remains appropriate    Co-evaluation              AM-PAC PT "6 Clicks" Mobility   Outcome Measure  Help needed turning from your back to your side while in a flat bed without using bedrails?: A Little Help needed moving from lying on your back to sitting on the side of a flat bed without using bedrails?: A Little   Help needed standing up from a chair using your arms (e.g., wheelchair or bedside chair)?: A Little Help needed to walk in hospital room?: A Little Help needed climbing 3-5 steps with a railing? : A Little 6 Click Score: 15    End of Session Equipment Utilized During Treatment: Gait belt Activity Tolerance: Patient tolerated treatment well Patient left: in chair;with call bell/phone within reach;with family/visitor present;with nursing/sitter in room(CNA entered room at end of session for bathing) Nurse Communication: Mobility status PT Visit Diagnosis: Muscle weakness (generalized) (M62.81);Other abnormalities of gait and mobility (R26.89);Pain Pain - Right/Left: Left Pain - part of body: Hip     Time: 1016-1040 PT Time Calculation (min) (ACUTE ONLY): 24 min  Charges:  $Gait Training: 8-22 mins $Therapeutic Exercise: 8-22 mins                     D. Scott Alayna Mabe PT, DPT 03/24/18, 11:38 AM

## 2018-03-24 NOTE — Evaluation (Signed)
Occupational Therapy Evaluation Patient Details Name: Heidi Barrera MRN: [id number] DOB: [birth year] Today's Date: 03/24/2018    History of Present Illness Pt is a 68 yo female with DJD of the L hip was admitted to [facility] for elective L THA.  PMH includes COPD, depression, GERD, and R knee surgery.    Clinical Impression   Pt. presents with 4/5 left hip pain, posterior hip precautions, weakness, limited activity tolerance, and limited functional mobility which hinders her ability to complete basic ADL and IADL functioning.  Pt. resides in a mobile home with her daughter. Pt. was independent with ADLs, and IADL functioning: including meal preparation, and medication management. Pt. was able to drive. Pt. education was provided about posterior hip precautions, and A/E use for LE ADL tasks. Pt. could benefit from OT services for ADL training, A/E training, and additional education to reinforce posterior hip precautions, and apply it during ADL tasks. Pt. Also continues to benefit from education about home modification, and DME. Pt. plans to return home upon discharge, with family assistance as needed. Pt.'s daughter plans to assist with preparing meals, and ADLS as necessary. No follow-up OT services are warranted at this time.    Follow Up Recommendations  No OT follow up    Equipment Recommendations  Tub/shower bench    Recommendations for Other Services       Precautions / Restrictions Precautions Precautions: Posterior Hip;Fall Precaution Booklet Issued: Yes (comment) Precaution Comments: Precaution education and review provided with pt and daughters Restrictions Weight Bearing Restrictions: Yes LLE Weight Bearing: Weight bearing as tolerated      Mobility Bed Mobility               General bed mobility comments: Pt. up in chair upon arrival  Transfers       Sit to Stand: Supervision              Balance     Sitting balance-Leahy Scale: Good       Standing  balance-Leahy Scale: Good                             ADL either performed or assessed with clinical judgement   ADL Overall ADL's : Needs assistance/impaired Eating/Feeding: Set up;Independent;Sitting   Grooming: Set up;Independent   Upper Body Bathing: Set up;Independent   Lower Body Bathing: Moderate assistance;Set up   Upper Body Dressing : Set up;Independent;Sitting   Lower Body Dressing: Moderate assistance;Set up   Toilet Transfer: Supervision/safety   Toileting- Clothing Manipulation and Hygiene: Independent       Functional mobility during ADLs: Supervision/safety General ADL Comments: Pt. edcuation was provided about Posterior hip precautions, and A/E use for LE ADLs.     Vision         Perception     Praxis      Pertinent Vitals/Pain Pain Assessment: 0-10 Pain Score: 4  Pain Location: L hip Pain Descriptors / Indicators: Aching;Sore Pain Intervention(s): Repositioned;Monitored during session     Hand Dominance Right   Extremity/Trunk Assessment Upper Extremity Assessment Upper Extremity Assessment: Overall WFL for tasks assessed           Communication Communication Communication: No difficulties   Cognition Arousal/Alertness: Awake/alert Behavior During Therapy: WFL for tasks assessed/performed Overall Cognitive Status: Within Functional Limits for tasks assessed  General Comments       Exercises     Shoulder Instructions      Home Living Family/patient expects to be discharged to:: Private residence Living Arrangements: Children Available Help at Discharge: Family;Available 24 hours/day Type of Home: Mobile home Home Access: Stairs to enter Entrance Stairs-Number of Steps: 5 Entrance Stairs-Rails: Right Home Layout: One level     Bathroom Shower/Tub: Tub/shower unit         Home Equipment: Bedside commode;Shower seat;Walker - 2 wheels          Prior  Functioning/Environment Level of Independence: Independent                 OT Problem List: Decreased strength;Decreased activity tolerance;Pain;Decreased knowledge of use of DME or AE      OT Treatment/Interventions: Self-care/ADL training;Therapeutic exercise;Patient/family education;Therapeutic activities    OT Goals(Current goals can be found in the care plan section) Acute Rehab OT Goals Patient Stated Goal: To regain independence OT Goal Formulation: With patient Potential to Achieve Goals: Good  OT Frequency: Min 2X/week   Barriers to D/C:            Co-evaluation              AM-PAC OT "6 Clicks" Daily Activity     Outcome Measure Help from another person eating meals?: None Help from another person taking care of personal grooming?: None Help from another person toileting, which includes using toliet, bedpan, or urinal?: A Little Help from another person bathing (including washing, rinsing, drying)?: A Little Help from another person to put on and taking off regular upper body clothing?: None Help from another person to put on and taking off regular lower body clothing?: A Lot 6 Click Score: 20   End of Session Equipment Utilized During Treatment: Gait belt  Activity Tolerance: Patient tolerated treatment well Patient left: in chair;with call bell/phone within reach;with family/visitor present  OT Visit Diagnosis: Muscle weakness (generalized) (M62.81)                Time: 2263-3354 OT Time Calculation (min): 23 min Charges:  OT General Charges $OT Visit: 1 Visit OT Evaluation $OT Eval Moderate Complexity: 1 Mod  Harrel Carina, MS, OTR/L  Harrel Carina 03/24/2018, 10:33 AM

## 2018-03-24 NOTE — Care Management (Signed)
#  1.  S/W CYNTHIA  @ [employer] @ [phone] OPT- 2   1. LOVENOX  40 MG DAILY 14 SYRINGES COVER- NON-FORMULARY PRIOR APPROVAL- YES #  [id number] 2005   2. ENOXAPARIN   40 MG DAILY  14 SYRINGES COVER- YES CO-PAY- $ 6.90 TIER- 4  DRUG PRIOR APPROVAL- NO  DEDUCTIBLE : NOT MET  PREFERRED PHARMACY : YES [facility]

## 2018-03-24 NOTE — Progress Notes (Signed)
Physical Therapy Treatment Patient Details Name: Heidi Barrera MRN: [id number] DOB: 12-13-1950 Today's Date: 03/24/2018    History of Present Illness Pt is a 68 yo female with DJD of the L hip was admitted to [facility] for elective L THA.  PMH includes COPD, depression, GERD, and R knee surgery.     PT Comments    Pt presents with deficits in strength, transfers, mobility, gait, balance, and activity tolerance but continues to make very good progress towards goals.  Pt was able to amb 2 x 125' this session with a RW and CGA with improved gait quality including increasing cadence and increasing LLE stance time.  Pt was abel to ascend and descend 4 steps with one rail with CGA and cues for sequencing with good control and stability.  Pt will benefit from HHPT services upon discharge to safely address above deficits for decreased caregiver assistance and eventual return to PLOF.     Follow Up Recommendations  Home health PT     Equipment Recommendations  Rolling walker with 5" wheels;Other (comment)    Recommendations for Other Services       Precautions / Restrictions Precautions Precautions: Posterior Hip;Fall Precaution Booklet Issued: Yes (comment) Precaution Comments: Precaution education and review provided with pt and daughter Restrictions Weight Bearing Restrictions: Yes LLE Weight Bearing: Weight bearing as tolerated    Mobility  Bed Mobility               General bed mobility comments: NT, pt in recliner  Transfers Overall transfer level: Needs assistance Equipment used: Rolling walker (2 wheeled) Transfers: Sit to/from Stand Sit to Stand: Supervision         General transfer comment: Min verbal cues for sequencing for hip precaution compliance  Ambulation/Gait Ambulation/Gait assistance: Min guard Gait Distance (Feet): 125 Feet x 2 Assistive device: Rolling walker (2 wheeled) Gait Pattern/deviations: Step-through pattern;Antalgic;Decreased stance time -  left;Decreased step length - right Gait velocity: decreased   General Gait Details: Slow cadence with mildly antalgic gait but steady without LOB   Stairs Stairs: Yes Stairs assistance: Min guard Stair Management: One rail Right;Two rails Number of Stairs: 4 General stair comments: Visual demonstration and teach back strategy prior to stair training with pt able to ascend/descend steps with B rails initially and then one rail with good control and stability.     Wheelchair Mobility    Modified Rankin (Stroke Patients Only)       Balance Overall balance assessment: Needs assistance Sitting-balance support: Single extremity supported Sitting balance-Leahy Scale: Good     Standing balance support: No upper extremity supported Standing balance-Leahy Scale: Good Standing balance comment: Pt steady during static standing balance training with feet apart, together, and semi-tandem                            Cognition Arousal/Alertness: Awake/alert Behavior During Therapy: WFL for tasks assessed/performed Overall Cognitive Status: Within Functional Limits for tasks assessed                                        Exercises Total Joint Exercises Ankle Circles/Pumps: Both;10 reps;15 reps Quad Sets: Strengthening;Both;10 reps;15 reps Gluteal Sets: Strengthening;Both;10 reps Long Arc Quad: AROM;Both;10 reps;15 reps Knee Flexion: AROM;Both;10 reps;15 reps Marching in Standing: AROM;Both;10 reps;Standing Other Exercises Other Exercises: Car transfer training practice using a room chair with  daughter and pt to simulate car transfer technique/sequencing Other Exercises: Posterior hip precaution education with pt and family verbally and during transfers and gait for functional application    General Comments        Pertinent Vitals/Pain Pain Assessment: 0-10 Pain Score: 5  Pain Location: L hip Pain Descriptors / Indicators: Aching;Sore Pain  Intervention(s): Monitored during session    Home Living                      Prior Function            PT Goals (current goals can now be found in the care plan section) Progress towards PT goals: Progressing toward goals    Frequency    BID      PT Plan Current plan remains appropriate    Co-evaluation              AM-PAC PT "6 Clicks" Mobility   Outcome Measure  Help needed turning from your back to your side while in a flat bed without using bedrails?: A Little Help needed moving from lying on your back to sitting on the side of a flat bed without using bedrails?: A Little Help needed moving to and from a bed to a chair (including a wheelchair)?: A Little Help needed standing up from a chair using your arms (e.g., wheelchair or bedside chair)?: A Little Help needed to walk in hospital room?: A Little Help needed climbing 3-5 steps with a railing? : A Little 6 Click Score: 18    End of Session Equipment Utilized During Treatment: Gait belt Activity Tolerance: Patient tolerated treatment well Patient left: in chair;with call bell/phone within reach;with family/visitor present;with chair alarm set;with SCD's reapplied Nurse Communication: Mobility status PT Visit Diagnosis: Muscle weakness (generalized) (M62.81);Other abnormalities of gait and mobility (R26.89);Pain Pain - Right/Left: Left Pain - part of body: Hip     Time: 1336-1400 PT Time Calculation (min) (ACUTE ONLY): 24 min  Charges:  $Gait Training: 8-22 mins $Therapeutic Exercise: 8-22 mins $Therapeutic Activity: 8-22 mins                     D. Scott Lucy Woolever PT, DPT 03/24/18, 2:58 PM

## 2018-03-24 NOTE — Progress Notes (Signed)
   Subjective: 1 Day Post-Op Procedure(s) (LRB): TOTAL HIP ARTHROPLASTY-LEFT (Left) Patient reports pain as mild.   Patient is well, and has had no acute complaints or problems Denies any CP, SOB, ABD pain. We will continue therapy today.  Plan is to go Home after hospital stay.  Objective: Vital signs in last 24 hours: Temp:  [97.3 F (36.3 C)-98.3 F (36.8 C)] 98.3 F (36.8 C) (03/05 2341) Pulse Rate:  [62-88] 62 (03/05 2341) Resp:  [17-24] 18 (03/05 2341) BP: (109-133)/(60-86) 117/60 (03/05 2341) SpO2:  [97 %-100 %] 99 % (03/05 2341)  Intake/Output from previous day: 03/05 0701 - 03/06 0700 In: 2548 [I.V.:2548] Out: 1150 [Urine:1050; Blood:100] Intake/Output this shift: No intake/output data recorded.  Recent Labs    03/24/18 0457  HGB 9.6*   Recent Labs    03/24/18 0457  WBC 10.1  RBC 2.93*  HCT 29.9*  PLT 169   Recent Labs    03/24/18 0457  NA 141  K 3.8  CL 109  CO2 25  BUN 12  CREATININE 0.60  GLUCOSE 127*  CALCIUM 8.6*   No results for input(s): LABPT, INR in the last 72 hours.  EXAM General - Patient is Alert, Appropriate and Oriented Extremity - Neurovascular intact Sensation intact distally Intact pulses distally Dorsiflexion/Plantar flexion intact No cellulitis present Compartment soft  - homans sign Dressing - dressing C/D/I and scant drainage Motor Function - intact, moving foot and toes well on exam.   Past Medical History:  Diagnosis Date  . Arthritis   . Depression   . Eczema   . GERD (gastroesophageal reflux disease)   . Hemorrhoids   . Shingles     Assessment/Plan:   1 Day Post-Op Procedure(s) (LRB): TOTAL HIP ARTHROPLASTY-LEFT (Left) Active Problems:   Status post total hip replacement, left  Estimated body mass index is 22.98 kg/m as calculated from the following:   Height as of this encounter: 4\' 8"  (1.422 m).   Weight as of this encounter: 46.5 kg. Advance diet Up with therapy  Needs BM VSS Acute post op  blood loss anemia - Hgb 9.6. Start Iron supplement CM to assist with discharge to home with HHPT   DVT Prophylaxis - Aspirin, Lovenox, TED hose and SCDs Weight-Bearing as tolerated to left leg   T. Rachelle Hora, PA-C [place] 03/24/2018, 7:57 AM

## 2018-03-24 NOTE — Anesthesia Postprocedure Evaluation (Signed)
Anesthesia Post Note  Patient: Heidi Barrera  Procedure(s) Performed: TOTAL HIP ARTHROPLASTY-LEFT (Left )  Patient location during evaluation: Nursing Unit Anesthesia Type: Spinal Level of consciousness: awake, awake and alert and oriented Pain management: pain level controlled Vital Signs Assessment: post-procedure vital signs reviewed and stable Respiratory status: spontaneous breathing and nonlabored ventilation Cardiovascular status: stable Postop Assessment: no headache, no backache, patient able to bend at knees, able to ambulate, adequate PO intake and no apparent nausea or vomiting Anesthetic complications: no     Last Vitals:  Vitals:   03/23/18 1626 03/23/18 2341  BP: 109/71 117/60  Pulse: 76 62  Resp: 18 18  Temp: 36.8 C 36.8 C  SpO2: 99% 99%    Last Pain:  Vitals:   03/23/18 2341  TempSrc: Oral  PainSc:                  [name]

## 2018-03-25 LAB — BASIC METABOLIC PANEL
Anion gap: 7 (ref 5–15)
BUN: 9 mg/dL (ref 8–23)
CO2: 26 mmol/L (ref 22–32)
CREATININE: 0.59 mg/dL (ref 0.44–1.00)
Calcium: 8.1 mg/dL — ABNORMAL LOW (ref 8.9–10.3)
Chloride: 108 mmol/L (ref 98–111)
GFR calc Af Amer: >60 mL/min (ref >60)
GFR calc non Af Amer: >60 mL/min (ref >60)
Glucose, Bld: 98 mg/dL (ref 70–99)
Potassium: 3.6 mmol/L (ref 3.5–5.1)
Sodium: 141 mmol/L (ref 135–145)

## 2018-03-25 LAB — CBC
HCT: 31.5 % — ABNORMAL LOW (ref 36.0–46.0)
Hemoglobin: 10 g/dL — ABNORMAL LOW (ref 12.0–15.0)
MCH: 32.8 pg (ref 26.0–34.0)
MCHC: 31.7 g/dL (ref 30.0–36.0)
MCV: 103.3 fL — ABNORMAL HIGH (ref 80.0–100.0)
Platelets: 162 10*3/uL (ref 150–400)
RBC: 3.05 MIL/uL — ABNORMAL LOW (ref 3.87–5.11)
RDW: 12.8 % (ref 11.5–15.5)
WBC: 7.4 10*3/uL (ref 4.0–10.5)
nRBC: 0 % (ref 0.0–0.2)

## 2018-03-25 NOTE — Progress Notes (Signed)
Physical Therapy Treatment Patient Details Name: Heidi Barrera MRN: [id number] DOB: Feb 25, 1950 Today's Date: 03/25/2018    History of Present Illness Pt is a 68 yo female with DJD of the L hip was admitted to [facility] for elective L THA.  PMH includes COPD, depression, GERD, and R knee surgery.     PT Comments    Pt ready for session.  Stood and was able to ambulate around unit and up/down 4 steps with min guard.  No LOB or buckling noted.  Reviewed home safety and HEP.  Participated in exercises as described below. No further questions regarding mobility.   Follow Up Recommendations  Home health PT     Equipment Recommendations  Rolling walker with 5" wheels;Other (comment)    Recommendations for Other Services       Precautions / Restrictions Precautions Precautions: Posterior Hip;Fall Precaution Comments: Precaution education and review provided with pt and daughter Restrictions Weight Bearing Restrictions: Yes LLE Weight Bearing: Weight bearing as tolerated    Mobility  Bed Mobility               General bed mobility comments: NT, pt in recliner  Transfers Overall transfer level: Needs assistance Equipment used: Rolling walker (2 wheeled) Transfers: Sit to/from Stand Sit to Stand: Supervision            Ambulation/Gait Ambulation/Gait assistance: Min guard Gait Distance (Feet): 180 Feet Assistive device: Rolling walker (2 wheeled) Gait Pattern/deviations: Step-through pattern Gait velocity: decreased   General Gait Details: Slow cadence with mildly antalgic gait but steady without LOB   Stairs Stairs: Yes Stairs assistance: Min guard Stair Management: One rail Right;Step to pattern Number of Stairs: 4     Wheelchair Mobility    Modified Rankin (Stroke Patients Only)       Balance Overall balance assessment: Needs assistance Sitting-balance support: Single extremity supported Sitting balance-Leahy Scale: Good     Standing balance  support: No upper extremity supported Standing balance-Leahy Scale: Good                              Cognition Arousal/Alertness: Awake/alert Behavior During Therapy: WFL for tasks assessed/performed Overall Cognitive Status: Within Functional Limits for tasks assessed                                        Exercises Other Exercises Other Exercises: verbal review HEP and return demo of standing ex for SLR, marches and ab/add x 10 with walker    General Comments        Pertinent Vitals/Pain Pain Assessment: 0-10 Pain Score: 2  Pain Location: L hip Pain Descriptors / Indicators: Tightness Pain Intervention(s): Limited activity within patient's tolerance;Monitored during session    Home Living                      Prior Function            PT Goals (current goals can now be found in the care plan section) Progress towards PT goals: Progressing toward goals    Frequency    BID      PT Plan Current plan remains appropriate    Co-evaluation              AM-PAC PT "6 Clicks" Mobility   Outcome Measure  Help needed turning from your  back to your side while in a flat bed without using bedrails?: A Little Help needed moving from lying on your back to sitting on the side of a flat bed without using bedrails?: A Little Help needed moving to and from a bed to a chair (including a wheelchair)?: A Little Help needed standing up from a chair using your arms (e.g., wheelchair or bedside chair)?: A Little Help needed to walk in hospital room?: A Little Help needed climbing 3-5 steps with a railing? : A Little 6 Click Score: 18    End of Session Equipment Utilized During Treatment: Gait belt Activity Tolerance: Patient tolerated treatment well Patient left: in chair;with call bell/phone within reach;with family/visitor present;with chair alarm set Nurse Communication: Other (comment) Pain - Right/Left: Left Pain - part of body:  Hip     Time: 7062-3762 PT Time Calculation (min) (ACUTE ONLY): 11 min  Charges:  $Gait Training: 8-22 mins                    Chesley Noon, PTA 03/25/18, 9:50 AM

## 2018-03-25 NOTE — Progress Notes (Signed)
   Subjective: 2 Days Post-Op Procedure(s) (LRB): TOTAL HIP ARTHROPLASTY-LEFT (Left) Patient reports pain as mild.   Patient is well, and has had no acute complaints or problems Denies any CP, SOB, ABD pain. We will continue therapy today.  Plan is to go Home after hospital stay.  Objective: Vital signs in last 24 hours: Temp:  [98.2 F (36.8 C)-101.2 F (38.4 C)] 99.6 F (37.6 C) (03/07 0127) Pulse Rate:  [64-85] 85 (03/06 2349) Resp:  [18] 18 (03/06 2349) BP: (113-130)/(53-57) 113/57 (03/06 2349) SpO2:  [96 %-100 %] 96 % (03/06 2349)  Intake/Output from previous day: 03/06 0701 - 03/07 0700 In: 600 [P.O.:600] Out: -  Intake/Output this shift: No intake/output data recorded.  Recent Labs    03/24/18 0457 03/25/18 0456  HGB 9.6* 10.0*   Recent Labs    03/24/18 0457 03/25/18 0456  WBC 10.1 7.4  RBC 2.93* 3.05*  HCT 29.9* 31.5*  PLT 169 162   Recent Labs    03/24/18 0457 03/25/18 0456  NA 141 141  K 3.8 3.6  CL 109 108  CO2 25 26  BUN 12 9  CREATININE 0.60 0.59  GLUCOSE 127* 98  CALCIUM 8.6* 8.1*   No results for input(s): LABPT, INR in the last 72 hours.  EXAM General - Patient is Alert, Appropriate and Oriented Extremity - Neurovascular intact Sensation intact distally Intact pulses distally Dorsiflexion/Plantar flexion intact No cellulitis present Compartment soft  - homans sign Dressing - dressing C/D/I and scant drainage Motor Function - intact, moving foot and toes well on exam.   Past Medical History:  Diagnosis Date  . Arthritis   . Depression   . Eczema   . GERD (gastroesophageal reflux disease)   . Hemorrhoids   . Shingles     Assessment/Plan:   2 Days Post-Op Procedure(s) (LRB): TOTAL HIP ARTHROPLASTY-LEFT (Left) Active Problems:   Status post total hip replacement, left  Estimated body mass index is 22.98 kg/m as calculated from the following:   Height as of this encounter: 4\' 8"  (1.422 m).   Weight as of this  encounter: 46.5 kg. Advance diet Up with therapy  VSS Acute post op blood loss anemia - Hgb 10.0 trending up CM to assist with discharge to home with HHPT Discharge home today  DVT Prophylaxis - Aspirin, Lovenox, TED hose and SCDs Weight-Bearing as tolerated to left leg   T. Rachelle Hora, PA-C [employer] [facility] 03/25/2018, 8:10 AM

## 2018-03-25 NOTE — Progress Notes (Signed)
DISCHARGE NOTE:  Pt and daughters given discharge instructions and scripts. Pt verbalized understanding. Pts walker sent with pt. Pt wheeled to car by staff member, daughter providing transportation.

## 2018-03-25 NOTE — Care Management Note (Signed)
Case Management Note  Patient Details  Name: Heidi Barrera MRN: [id number] Date of Birth: 10-26-1950  Subjective/Objective:   Patient to be discharged per MD order. Orders in place for home health services. Previous RNCM had completed workup for home health via [facility]. Notified Corene Cornea from [facility] of pending discharge. Rolling walker delivered via [facility]. Family to transport.                  Action/Plan:   Expected Discharge Date:  03/25/18               Expected Discharge Plan:     In-House Referral:     Discharge planning Services  CM Consult  Post Acute Care Choice:  Home Health Choice offered to:  Patient  DME Arranged:  Walker rolling DME Agency:  [facility]  HH Arranged:  PT [place] Agency:  [place] ([facility])  Status of Service:  Completed, signed off  If discussed at [place] of Stay Meetings, dates discussed:    Additional Comments:  Latanya Maudlin, RN 03/25/2018, 8:46 AM

## 2018-03-27 LAB — SURGICAL PATHOLOGY

## 2018-07-17 ENCOUNTER — Other Ambulatory Visit: Payer: Self-pay

## 2018-07-17 ENCOUNTER — Encounter: Payer: Self-pay | Admitting: *Deleted

## 2018-07-17 NOTE — Discharge Instructions (Signed)

## 2018-07-18 ENCOUNTER — Other Ambulatory Visit
Admission: RE | Admit: 2018-07-18 | Discharge: 2018-07-18 | Disposition: A | Discharge status: Home / Self Care | Payer: Medicare Other | Source: Ambulatory Visit | Attending: Ophthalmology | Admitting: Ophthalmology

## 2018-07-18 DIAGNOSIS — Z1159 Encounter for screening for other viral diseases: Secondary | ICD-10-CM | POA: Insufficient documentation

## 2018-07-19 LAB — NOVEL CORONAVIRUS, NAA (HOSP ORDER, SEND-OUT TO REF LAB; TAT 18-24 HRS): SARS-CoV-2, NAA: NOT DETECTED

## 2018-07-21 ENCOUNTER — Ambulatory Visit: Payer: Medicare Other | Admitting: Anesthesiology

## 2018-07-21 ENCOUNTER — Other Ambulatory Visit: Payer: Self-pay

## 2018-07-21 ENCOUNTER — Encounter
Admission: RE | Disposition: A | Discharge status: Home / Self Care | Payer: Self-pay | Source: Home / Self Care | Attending: Ophthalmology

## 2018-07-21 ENCOUNTER — Ambulatory Visit
Admission: RE | Admit: 2018-07-21 | Discharge: 2018-07-21 | Disposition: A | Discharge status: Home / Self Care | Payer: Medicare Other | Attending: Ophthalmology | Admitting: Ophthalmology

## 2018-07-21 DIAGNOSIS — F329 Major depressive disorder, single episode, unspecified: Secondary | ICD-10-CM | POA: Insufficient documentation

## 2018-07-21 DIAGNOSIS — Z7982 Long term (current) use of aspirin: Secondary | ICD-10-CM | POA: Diagnosis not present

## 2018-07-21 DIAGNOSIS — F17200 Nicotine dependence, unspecified, uncomplicated: Secondary | ICD-10-CM | POA: Insufficient documentation

## 2018-07-21 DIAGNOSIS — K219 Gastro-esophageal reflux disease without esophagitis: Secondary | ICD-10-CM | POA: Insufficient documentation

## 2018-07-21 DIAGNOSIS — F419 Anxiety disorder, unspecified: Secondary | ICD-10-CM | POA: Diagnosis not present

## 2018-07-21 DIAGNOSIS — H2511 Age-related nuclear cataract, right eye: Secondary | ICD-10-CM | POA: Insufficient documentation

## 2018-07-21 DIAGNOSIS — Z79899 Other long term (current) drug therapy: Secondary | ICD-10-CM | POA: Insufficient documentation

## 2018-07-21 DIAGNOSIS — M1990 Unspecified osteoarthritis, unspecified site: Secondary | ICD-10-CM | POA: Insufficient documentation

## 2018-07-21 HISTORY — PX: CATARACT EXTRACTION W/PHACO: SHX586

## 2018-07-21 HISTORY — DX: Age-related osteoporosis without current pathological fracture: M81.0

## 2018-07-21 HISTORY — DX: Presence of dental prosthetic device (complete) (partial): Z97.2

## 2018-07-21 SURGERY — PHACOEMULSIFICATION, CATARACT, WITH IOL INSERTION
Anesthesia: Monitor Anesthesia Care | Site: Eye | Laterality: Right

## 2018-07-21 MED ORDER — MIDAZOLAM HCL 2 MG/2ML IJ SOLN
INTRAMUSCULAR | Status: DC | PRN
  Administered 2018-07-21: 1 mg via INTRAVENOUS
  Administered 2018-07-21: 0.5 mg via INTRAVENOUS

## 2018-07-21 MED ORDER — BRIMONIDINE TARTRATE-TIMOLOL 0.2-0.5 % OP SOLN
OPHTHALMIC | Status: DC | PRN
  Administered 2018-07-21: 1 [drp] via OPHTHALMIC

## 2018-07-21 MED ORDER — ARMC OPHTHALMIC DILATING DROPS
1.0000 "application " | OPHTHALMIC | Status: DC | PRN
  Administered 2018-07-21 (×3): 1 via OPHTHALMIC

## 2018-07-21 MED ORDER — FENTANYL CITRATE (PF) 100 MCG/2ML IJ SOLN: 25.0000 ug | INTRAMUSCULAR | Status: DC | PRN

## 2018-07-21 MED ORDER — CEFUROXIME OPHTHALMIC INJECTION 1 MG/0.1 ML
INJECTION | OPHTHALMIC | Status: DC | PRN
  Administered 2018-07-21: 0.1 mL via INTRACAMERAL

## 2018-07-21 MED ORDER — OXYCODONE HCL 5 MG/5ML PO SOLN: 5.0000 mg | Freq: Once | ORAL | Status: DC | PRN

## 2018-07-21 MED ORDER — TETRACAINE HCL 0.5 % OP SOLN
1.0000 [drp] | OPHTHALMIC | Status: DC | PRN
  Administered 2018-07-21 (×3): 1 [drp] via OPHTHALMIC

## 2018-07-21 MED ORDER — MOXIFLOXACIN HCL 0.5 % OP SOLN
1.0000 [drp] | OPHTHALMIC | Status: DC | PRN
  Administered 2018-07-21 (×3): 1 [drp] via OPHTHALMIC

## 2018-07-21 MED ORDER — NA HYALUR & NA CHOND-NA HYALUR 0.4-0.35 ML IO KIT
PACK | INTRAOCULAR | Status: DC | PRN
  Administered 2018-07-21: 1 mL via INTRAOCULAR

## 2018-07-21 MED ORDER — EPINEPHRINE PF 1 MG/ML IJ SOLN
INTRAOCULAR | Status: DC | PRN
  Administered 2018-07-21: 09:00:00 74 mL via OPHTHALMIC

## 2018-07-21 MED ORDER — FENTANYL CITRATE (PF) 100 MCG/2ML IJ SOLN
INTRAMUSCULAR | Status: DC | PRN
  Administered 2018-07-21: 50 ug via INTRAVENOUS

## 2018-07-21 MED ORDER — OXYCODONE HCL 5 MG PO TABS: 5.0000 mg | ORAL_TABLET | Freq: Once | ORAL | Status: DC | PRN

## 2018-07-21 MED ORDER — MEPERIDINE HCL 25 MG/ML IJ SOLN: 6.2500 mg | INTRAMUSCULAR | Status: DC | PRN

## 2018-07-21 MED ORDER — LIDOCAINE HCL (PF) 2 % IJ SOLN
INTRAOCULAR | Status: DC | PRN
  Administered 2018-07-21: 1 mL

## 2018-07-21 MED ORDER — LACTATED RINGERS IV SOLN: 10.0000 mL/h | INTRAVENOUS | Status: DC

## 2018-07-21 MED ORDER — PROMETHAZINE HCL 25 MG/ML IJ SOLN: 6.2500 mg | INTRAMUSCULAR | Status: DC | PRN

## 2018-07-21 SURGICAL SUPPLY — 20 items
CANNULA ANT/CHMB 27G (MISCELLANEOUS) ×1 IMPLANT
CANNULA ANT/CHMB 27GA (MISCELLANEOUS) ×3 IMPLANT
GLOVE SURG LX 7.5 STRW (GLOVE) ×2
GLOVE SURG LX STRL 7.5 STRW (GLOVE) ×1 IMPLANT
GLOVE SURG TRIUMPH 8.0 PF LTX (GLOVE) ×3 IMPLANT
GOWN STRL REUS W/ TWL LRG LVL3 (GOWN DISPOSABLE) ×2 IMPLANT
GOWN STRL REUS W/TWL LRG LVL3 (GOWN DISPOSABLE) ×4
LENS IOL TECNIS ITEC 22.5 (Intraocular Lens) ×2 IMPLANT
MARKER SKIN DUAL TIP RULER LAB (MISCELLANEOUS) ×3 IMPLANT
NDL FILTER BLUNT 18X1 1/2 (NEEDLE) ×1 IMPLANT
NEEDLE FILTER BLUNT 18X 1/2SAF (NEEDLE) ×2
NEEDLE FILTER BLUNT 18X1 1/2 (NEEDLE) ×1 IMPLANT
PACK CATARACT BRASINGTON (MISCELLANEOUS) ×3 IMPLANT
PACK EYE AFTER SURG (MISCELLANEOUS) ×3 IMPLANT
PACK OPTHALMIC (MISCELLANEOUS) ×3 IMPLANT
SYR 3ML LL SCALE MARK (SYRINGE) ×3 IMPLANT
SYR 5ML LL (SYRINGE) ×3 IMPLANT
SYR TB 1ML LUER SLIP (SYRINGE) ×3 IMPLANT
WATER STERILE IRR 500ML POUR (IV SOLUTION) ×3 IMPLANT
WIPE NON LINTING 3.25X3.25 (MISCELLANEOUS) ×3 IMPLANT

## 2018-07-21 NOTE — Anesthesia Procedure Notes (Signed)
Procedure Name: MAC Performed by: Tareq Dwan, CRNA Pre-anesthesia Checklist: Patient identified, Emergency Drugs available, Suction available, Timeout performed and Patient being monitored Patient Re-evaluated:Patient Re-evaluated prior to induction Oxygen Delivery Method: Nasal cannula Placement Confirmation: positive ETCO2       

## 2018-07-21 NOTE — Anesthesia Postprocedure Evaluation (Signed)
Anesthesia Post Note  Patient: Heidi Barrera  Procedure(s) Performed: CATARACT EXTRACTION PHACO AND INTRAOCULAR LENS PLACEMENT ([place]) RIGHT (Right Eye)  Patient location during evaluation: PACU Anesthesia Type: MAC Level of consciousness: awake and alert Pain management: pain level controlled Vital Signs Assessment: post-procedure vital signs reviewed and stable Respiratory status: spontaneous breathing, nonlabored ventilation, respiratory function stable and patient connected to nasal cannula oxygen Cardiovascular status: blood pressure returned to baseline and stable Postop Assessment: no apparent nausea or vomiting Anesthetic complications: no    Ozil Stettler ELAINE

## 2018-07-21 NOTE — Op Note (Signed)
LOCATION:  [place]   PREOPERATIVE DIAGNOSIS:    Nuclear sclerotic cataract right eye. H25.11   POSTOPERATIVE DIAGNOSIS:  Nuclear sclerotic cataract right eye.     PROCEDURE:  Phacoemusification with posterior chamber intraocular lens placement of the right eye   LENS:   Implant Name Type Inv. Item Serial No. Manufacturer Lot No. LRB No. Used Action  LENS IOL DIOP 22.5 - [id number] Intraocular Lens LENS IOL DIOP 22.5 [id number]  Right 1 Implanted        ULTRASOUND TIME: 17 % of 1 minutes, 6 seconds.  CDE 11.5   SURGEON:  Wyonia Hough, MD   ANESTHESIA:  Topical with tetracaine drops and 2% Xylocaine jelly, augmented with 1% preservative-free intracameral lidocaine.    COMPLICATIONS:  None.   DESCRIPTION OF PROCEDURE:  The patient was identified in the holding room and transported to the operating room and placed in the supine position under the operating microscope.  The right eye was identified as the operative eye and it was prepped and draped in the usual sterile ophthalmic fashion.   A 1 millimeter clear-corneal paracentesis was made at the 12:00 position.  0.5 ml of preservative-free 1% lidocaine was injected into the anterior chamber. The anterior chamber was filled with Viscoat viscoelastic.  A 2.4 millimeter keratome was used to make a near-clear corneal incision at the 9:00 position.  A curvilinear capsulorrhexis was made with a cystotome and capsulorrhexis forceps.  Balanced salt solution was used to hydrodissect and hydrodelineate the nucleus.   Phacoemulsification was then used in stop and chop fashion to remove the lens nucleus and epinucleus.  The remaining cortex was then removed using the irrigation and aspiration handpiece. Provisc was then placed into the capsular bag to distend it for lens placement.  A lens was then injected into the capsular bag.  The remaining viscoelastic was aspirated.   Wounds were hydrated with balanced salt solution.   The anterior chamber was inflated to a physiologic pressure with balanced salt solution.  No wound leaks were noted. Cefuroxime 0.1 ml of a 10mg /ml solution was injected into the anterior chamber for a dose of 1 mg of intracameral antibiotic at the completion of the case.   Timolol and Brimonidine drops were applied to the eye.  The patient was taken to the recovery room in stable condition without complications of anesthesia or surgery.   Heidi Barrera 07/21/2018, 8:53 AM

## 2018-07-21 NOTE — H&P (Signed)

## 2018-07-21 NOTE — Anesthesia Preprocedure Evaluation (Signed)
Anesthesia Evaluation  Patient identified by MRN, date of birth, ID band Patient awake    Reviewed: Allergy & Precautions, H&P , NPO status , Patient's Chart, lab work & pertinent test results, reviewed documented beta blocker date and time   Airway Mallampati: II  TM Distance: >3 FB Neck ROM: full    Dental no notable dental hx.    Pulmonary Current Smoker,    Pulmonary exam normal breath sounds clear to auscultation       Cardiovascular Exercise Tolerance: Good negative cardio ROS   Rhythm:regular Rate:Normal     Neuro/Psych PSYCHIATRIC DISORDERS Anxiety Depression negative neurological ROS     GI/Hepatic Neg liver ROS, GERD  ,  Endo/Other  negative endocrine ROS  Renal/GU negative Renal ROS  negative genitourinary   Musculoskeletal   Abdominal   Peds  Hematology negative hematology ROS (+)   Anesthesia Other Findings   Reproductive/Obstetrics negative OB ROS                             Anesthesia Physical Anesthesia Plan  ASA: II  Anesthesia Plan: MAC   Post-op Pain Management:    Induction:   PONV Risk Score and Plan:   Airway Management Planned:   Additional Equipment:   Intra-op Plan:   Post-operative Plan:   Informed Consent: I have reviewed the patients History and Physical, chart, labs and discussed the procedure including the risks, benefits and alternatives for the proposed anesthesia with the patient or authorized representative who has indicated his/her understanding and acceptance.     Dental Advisory Given  Plan Discussed with: CRNA  Anesthesia Plan Comments:         Anesthesia Quick Evaluation

## 2018-07-21 NOTE — Transfer of Care (Signed)
Immediate Anesthesia Transfer of Care Note  Patient: Heidi Barrera  Procedure(s) Performed: CATARACT EXTRACTION PHACO AND INTRAOCULAR LENS PLACEMENT ([facility]) RIGHT (Right Eye)  Patient Location: PACU  Anesthesia Type: MAC  Level of Consciousness: awake, alert  and patient cooperative  Airway and Oxygen Therapy: Patient Spontanous Breathing and Patient connected to supplemental oxygen  Post-op Assessment: Post-op Vital signs reviewed, Patient's Cardiovascular Status Stable, Respiratory Function Stable, Patent Airway and No signs of Nausea or vomiting  Post-op Vital Signs: Reviewed and stable  Complications: No apparent anesthesia complications

## 2018-07-24 ENCOUNTER — Encounter: Payer: Self-pay | Admitting: Ophthalmology

## 2018-08-10 ENCOUNTER — Other Ambulatory Visit: Payer: Medicare Other

## 2018-08-10 NOTE — Discharge Instructions (Signed)
General Anesthesia, Adult, Care After °This sheet gives you information about how to care for yourself after your procedure. Your health care provider may also give you more specific instructions. If you have problems or questions, contact your health care provider. °What can I expect after the procedure? °After the procedure, the following side effects are common: °· Pain or discomfort at the IV site. °· Nausea. °· Vomiting. °· Sore throat. °· Trouble concentrating. °· Feeling cold or chills. °· Weak or tired. °· Sleepiness and fatigue. °· Soreness and body aches. These side effects can affect parts of the body that were not involved in surgery. °Follow these instructions at home: ° °For at least 24 hours after the procedure: °· Have a responsible adult stay with you. It is important to have someone help care for you until you are awake and alert. °· Rest as needed. °· Do not: °? Participate in activities in which you could fall or become injured. °? Drive. °? Use heavy machinery. °? Drink alcohol. °? Take sleeping pills or medicines that cause drowsiness. °? Make important decisions or sign legal documents. °? Take care of children on your own. °Eating and drinking °· Follow any instructions from your health care provider about eating or drinking restrictions. °· When you feel hungry, start by eating small amounts of foods that are soft and easy to digest (bland), such as toast. Gradually return to your regular diet. °· Drink enough fluid to keep your urine pale yellow. °· If you vomit, rehydrate by drinking water, juice, or clear broth. °General instructions °· If you have sleep apnea, surgery and certain medicines can increase your risk for breathing problems. Follow instructions from your health care provider about wearing your sleep device: °? Anytime you are sleeping, including during daytime naps. °? While taking prescription pain medicines, sleeping medicines, or medicines that make you drowsy. °· Return to  your normal activities as told by your health care provider. Ask your health care provider what activities are safe for you. °· Take over-the-counter and prescription medicines only as told by your health care provider. °· If you smoke, do not smoke without supervision. °· Keep all follow-up visits as told by your health care provider. This is important. °Contact a health care provider if: °· You have nausea or vomiting that does not get better with medicine. °· You cannot eat or drink without vomiting. °· You have pain that does not get better with medicine. °· You are unable to pass urine. °· You develop a skin rash. °· You have a fever. °· You have redness around your IV site that gets worse. °Get help right away if: °· You have difficulty breathing. °· You have chest pain. °· You have blood in your urine or stool, or you vomit blood. °Summary °· After the procedure, it is common to have a sore throat or nausea. It is also common to feel tired. °· Have a responsible adult stay with you for the first 24 hours after general anesthesia. It is important to have someone help care for you until you are awake and alert. °· When you feel hungry, start by eating small amounts of foods that are soft and easy to digest (bland), such as toast. Gradually return to your regular diet. °· Drink enough fluid to keep your urine pale yellow. °· Return to your normal activities as told by your health care provider. Ask your health care provider what activities are safe for you. °This information is not   intended to replace advice given to you by your health care provider. Make sure you discuss any questions you have with your health care provider. °Document Released: 04/12/2000 Document Revised: 01/07/2017 Document Reviewed: 08/20/2016 °Elsevier Patient Education © 2020 [employer] °Cataract Surgery, Care After °This sheet gives you information about how to care for yourself after your procedure. Your health care provider may also  give you more specific instructions. If you have problems or questions, contact your health care provider. °What can I expect after the procedure? °After the procedure, it is common to have: °· Itching. °· Discomfort. °· Fluid discharge. °· Sensitivity to light and to touch. °· Bruising in or around the eye. °· Mild blurred vision. °Follow these instructions at home: °Eye care ° °· Do not touch or rub your eyes. °· Protect your eyes as told by your health care provider. You may be told to wear a protective eye shield or sunglasses. °· Do not put a contact lens into the affected eye or eyes until your health care provider approves. °· Keep the area around your eye clean and dry: °? Avoid swimming. °? Do not allow water to hit you directly in the face while showering. °? Keep soap and shampoo out of your eyes. °· Check your eye every day for signs of infection. Watch for: °? Redness, swelling, or pain. °? Fluid, blood, or pus. °? Warmth. °? A bad smell. °? Vision that is getting worse. °? Sensitivity that is getting worse. °Activity °· Do not drive for 24 hours if you were given a sedative during your procedure. °· Avoid strenuous activities, such as playing contact sports, for as long as told by your health care provider. °· Do not drive or use heavy machinery until your health care provider approves. °· Do not bend or lift heavy objects. Bending increases pressure in the eye. You can walk, climb stairs, and do light household chores. °· Ask your health care provider when you can return to work. If you work in a dusty environment, you may be advised to wear protective eyewear for a period of time. °General instructions °· Take or apply over-the-counter and prescription medicines only as told by your health care provider. This includes eye drops. °· Keep all follow-up visits as told by your health care provider. This is important. °Contact a health care provider if: °· You have increased bruising around your  eye. °· You have pain that is not helped with medicine. °· You have a fever. °· You have redness, swelling, or pain in your eye. °· You have fluid, blood, or pus coming from your incision. °· Your vision gets worse. °· Your sensitivity to light gets worse. °Get help right away if: °· You have sudden loss of vision. °· You see flashes of light or spots (floaters). °· You have severe eye pain. °· You develop nausea or vomiting. °Summary °· After your procedure, it is common to have itching, discomfort, bruising, fluid discharge, or sensitivity to light. °· Follow instructions from your health care provider about caring for your eye after the procedure. °· Do not rub your eye after the procedure. You may need to wear eye protection or sunglasses. Do not wear contact lenses. Keep the area around your eye clean and dry. °· Avoid activities that require a lot of effort. These include playing sports and lifting heavy objects. °· Contact a health care provider if you have increased bruising, pain that does not go away, or a fever. Get   help right away if you suddenly lose your vision, see flashes of light or spots, or have severe pain in the eye. °This information is not intended to replace advice given to you by your health care provider. Make sure you discuss any questions you have with your health care provider. °Document Released: 07/24/2004 Document Revised: 07/04/2017 Document Reviewed: 07/04/2017 °Elsevier Patient Education © 2020 [employer]vier Inc. ° °

## 2018-08-11 ENCOUNTER — Other Ambulatory Visit: Payer: Self-pay

## 2018-08-11 ENCOUNTER — Other Ambulatory Visit
Admission: RE | Admit: 2018-08-11 | Discharge: 2018-08-11 | Disposition: A | Discharge status: Home / Self Care | Payer: Medicare Other | Source: Ambulatory Visit | Attending: Ophthalmology | Admitting: Ophthalmology

## 2018-08-11 DIAGNOSIS — Z1159 Encounter for screening for other viral diseases: Secondary | ICD-10-CM | POA: Insufficient documentation

## 2018-08-12 LAB — SARS CORONAVIRUS 2 (TAT 6-24 HRS): SARS Coronavirus 2: NEGATIVE

## 2018-08-14 ENCOUNTER — Encounter: Payer: Self-pay | Admitting: *Deleted

## 2018-08-14 ENCOUNTER — Other Ambulatory Visit: Payer: Self-pay

## 2018-08-14 NOTE — Anesthesia Preprocedure Evaluation (Addendum)
Anesthesia Evaluation  Patient identified by MRN, date of birth, ID band Patient awake    Reviewed: Allergy & Precautions, NPO status , Patient's Chart, lab work & pertinent test results  History of Anesthesia Complications Negative for: history of anesthetic complications  Airway Mallampati: I   Neck ROM: Full    Dental  (+) Edentulous Upper, Edentulous Lower, Upper Dentures   Pulmonary COPD, Current Smoker (1/2 ppd),    Pulmonary exam normal breath sounds clear to auscultation       Cardiovascular Exercise Tolerance: Good negative cardio ROS Normal cardiovascular exam Rhythm:Regular Rate:Normal     Neuro/Psych PSYCHIATRIC DISORDERS Anxiety Depression negative neurological ROS     GI/Hepatic GERD  ,  Endo/Other  negative endocrine ROS  Renal/GU negative Renal ROS     Musculoskeletal  (+) Arthritis ,   Abdominal   Peds  Hematology negative hematology ROS (+)   Anesthesia Other Findings   Reproductive/Obstetrics                            Anesthesia Physical Anesthesia Plan  ASA: II  Anesthesia Plan: MAC   Post-op Pain Management:    Induction: Intravenous  PONV Risk Score and Plan: 1 and TIVA and Midazolam  Airway Management Planned: Natural Airway  Additional Equipment:   Intra-op Plan:   Post-operative Plan:   Informed Consent: I have reviewed the patients History and Physical, chart, labs and discussed the procedure including the risks, benefits and alternatives for the proposed anesthesia with the patient or authorized representative who has indicated his/her understanding and acceptance.       Plan Discussed with: CRNA  Anesthesia Plan Comments:        Anesthesia Quick Evaluation

## 2018-08-16 ENCOUNTER — Ambulatory Visit
Admission: RE | Admit: 2018-08-16 | Discharge: 2018-08-16 | Disposition: A | Discharge status: Home / Self Care | Payer: Medicare Other | Attending: Ophthalmology | Admitting: Ophthalmology

## 2018-08-16 ENCOUNTER — Other Ambulatory Visit: Payer: Self-pay

## 2018-08-16 ENCOUNTER — Encounter
Admission: RE | Disposition: A | Discharge status: Home / Self Care | Payer: Self-pay | Source: Home / Self Care | Attending: Ophthalmology

## 2018-08-16 ENCOUNTER — Ambulatory Visit: Payer: Medicare Other | Admitting: Anesthesiology

## 2018-08-16 DIAGNOSIS — Z7982 Long term (current) use of aspirin: Secondary | ICD-10-CM | POA: Diagnosis not present

## 2018-08-16 DIAGNOSIS — J449 Chronic obstructive pulmonary disease, unspecified: Secondary | ICD-10-CM | POA: Diagnosis not present

## 2018-08-16 DIAGNOSIS — M1990 Unspecified osteoarthritis, unspecified site: Secondary | ICD-10-CM | POA: Insufficient documentation

## 2018-08-16 DIAGNOSIS — F329 Major depressive disorder, single episode, unspecified: Secondary | ICD-10-CM | POA: Insufficient documentation

## 2018-08-16 DIAGNOSIS — K219 Gastro-esophageal reflux disease without esophagitis: Secondary | ICD-10-CM | POA: Diagnosis not present

## 2018-08-16 DIAGNOSIS — Z96642 Presence of left artificial hip joint: Secondary | ICD-10-CM | POA: Diagnosis not present

## 2018-08-16 DIAGNOSIS — F419 Anxiety disorder, unspecified: Secondary | ICD-10-CM | POA: Insufficient documentation

## 2018-08-16 DIAGNOSIS — Z85828 Personal history of other malignant neoplasm of skin: Secondary | ICD-10-CM | POA: Diagnosis not present

## 2018-08-16 DIAGNOSIS — H2512 Age-related nuclear cataract, left eye: Secondary | ICD-10-CM | POA: Diagnosis present

## 2018-08-16 DIAGNOSIS — Z79899 Other long term (current) drug therapy: Secondary | ICD-10-CM | POA: Diagnosis not present

## 2018-08-16 HISTORY — PX: CATARACT EXTRACTION W/PHACO: SHX586

## 2018-08-16 SURGERY — PHACOEMULSIFICATION, CATARACT, WITH IOL INSERTION
Anesthesia: Monitor Anesthesia Care | Site: Eye | Laterality: Left

## 2018-08-16 MED ORDER — ACETAMINOPHEN 160 MG/5ML PO SOLN: 325.0000 mg | ORAL | Status: DC | PRN

## 2018-08-16 MED ORDER — TETRACAINE HCL 0.5 % OP SOLN
1.0000 [drp] | OPHTHALMIC | Status: DC | PRN
  Administered 2018-08-16 (×3): 1 [drp] via OPHTHALMIC

## 2018-08-16 MED ORDER — EPINEPHRINE PF 1 MG/ML IJ SOLN
INTRAOCULAR | Status: DC | PRN
  Administered 2018-08-16: 64 mL via OPHTHALMIC

## 2018-08-16 MED ORDER — LIDOCAINE HCL (PF) 2 % IJ SOLN
INTRAOCULAR | Status: DC | PRN
  Administered 2018-08-16: 1 mL

## 2018-08-16 MED ORDER — NA HYALUR & NA CHOND-NA HYALUR 0.4-0.35 ML IO KIT
PACK | INTRAOCULAR | Status: DC | PRN
  Administered 2018-08-16: 1 mL via INTRAOCULAR

## 2018-08-16 MED ORDER — ACETAMINOPHEN 325 MG PO TABS: 650.0000 mg | ORAL_TABLET | Freq: Once | ORAL | Status: DC | PRN

## 2018-08-16 MED ORDER — BRIMONIDINE TARTRATE-TIMOLOL 0.2-0.5 % OP SOLN
OPHTHALMIC | Status: DC | PRN
  Administered 2018-08-16: 1 [drp] via OPHTHALMIC

## 2018-08-16 MED ORDER — ARMC OPHTHALMIC DILATING DROPS
1.0000 "application " | OPHTHALMIC | Status: DC | PRN
  Administered 2018-08-16 (×3): 1 via OPHTHALMIC

## 2018-08-16 MED ORDER — MIDAZOLAM HCL 2 MG/2ML IJ SOLN
INTRAMUSCULAR | Status: DC | PRN
  Administered 2018-08-16: 1.5 mg via INTRAVENOUS

## 2018-08-16 MED ORDER — ONDANSETRON HCL 4 MG/2ML IJ SOLN: 4.0000 mg | Freq: Once | INTRAMUSCULAR | Status: DC | PRN

## 2018-08-16 MED ORDER — MOXIFLOXACIN HCL 0.5 % OP SOLN
1.0000 [drp] | OPHTHALMIC | Status: DC | PRN
  Administered 2018-08-16 (×3): 1 [drp] via OPHTHALMIC

## 2018-08-16 MED ORDER — CEFUROXIME OPHTHALMIC INJECTION 1 MG/0.1 ML
INJECTION | OPHTHALMIC | Status: DC | PRN
  Administered 2018-08-16: 0.1 mL via INTRACAMERAL

## 2018-08-16 MED ORDER — FENTANYL CITRATE (PF) 100 MCG/2ML IJ SOLN
INTRAMUSCULAR | Status: DC | PRN
  Administered 2018-08-16: 50 ug via INTRAVENOUS

## 2018-08-16 SURGICAL SUPPLY — 26 items
CANNULA ANT/CHMB 27G (MISCELLANEOUS) ×1 IMPLANT
CANNULA ANT/CHMB 27GA (MISCELLANEOUS) ×3 IMPLANT
GLOVE SURG LX 7.5 STRW (GLOVE) ×4
GLOVE SURG LX STRL 7.5 STRW (GLOVE) ×1 IMPLANT
GLOVE SURG TRIUMPH 8.0 PF LTX (GLOVE) ×3 IMPLANT
GOWN STRL REUS W/ TWL LRG LVL3 (GOWN DISPOSABLE) ×2 IMPLANT
GOWN STRL REUS W/TWL LRG LVL3 (GOWN DISPOSABLE) ×4
LENS IOL TECNIS ITEC 22.5 (Intraocular Lens) ×2 IMPLANT
MARKER SKIN DUAL TIP RULER LAB (MISCELLANEOUS) ×3 IMPLANT
NDL FILTER BLUNT 18X1 1/2 (NEEDLE) ×1 IMPLANT
NDL RETROBULBAR .5 NSTRL (NEEDLE) IMPLANT
NEEDLE FILTER BLUNT 18X 1/2SAF (NEEDLE) ×2
NEEDLE FILTER BLUNT 18X1 1/2 (NEEDLE) ×1 IMPLANT
PACK CATARACT BRASINGTON (MISCELLANEOUS) ×3 IMPLANT
PACK EYE AFTER SURG (MISCELLANEOUS) ×3 IMPLANT
PACK OPTHALMIC (MISCELLANEOUS) ×3 IMPLANT
RING MALYGIN 7.0 (MISCELLANEOUS) IMPLANT
SUT ETHILON 10-0 CS-B-6CS-B-6 (SUTURE)
SUT VICRYL  9 0 (SUTURE)
SUT VICRYL 9 0 (SUTURE) IMPLANT
SUTURE EHLN 10-0 CS-B-6CS-B-6 (SUTURE) IMPLANT
SYR 3ML LL SCALE MARK (SYRINGE) ×3 IMPLANT
SYR 5ML LL (SYRINGE) ×3 IMPLANT
SYR TB 1ML LUER SLIP (SYRINGE) ×3 IMPLANT
WATER STERILE IRR 500ML POUR (IV SOLUTION) ×3 IMPLANT
WIPE NON LINTING 3.25X3.25 (MISCELLANEOUS) ×3 IMPLANT

## 2018-08-16 NOTE — Anesthesia Postprocedure Evaluation (Signed)
Anesthesia Post Note  Patient: Heidi Barrera  Procedure(s) Performed: CATARACT EXTRACTION PHACO AND INTRAOCULAR LENS PLACEMENT ([place])  LEFT (Left Eye)  Patient location during evaluation: PACU Anesthesia Type: MAC Level of consciousness: awake and alert, oriented and patient cooperative Pain management: pain level controlled Vital Signs Assessment: post-procedure vital signs reviewed and stable Respiratory status: spontaneous breathing, nonlabored ventilation and respiratory function stable Cardiovascular status: blood pressure returned to baseline and stable Postop Assessment: adequate PO intake Anesthetic complications: no    Darrin Nipper

## 2018-08-16 NOTE — Transfer of Care (Signed)
Immediate Anesthesia Transfer of Care Note  Patient: Heidi Barrera  Procedure(s) Performed: CATARACT EXTRACTION PHACO AND INTRAOCULAR LENS PLACEMENT ([facility])  LEFT (Left Eye)  Patient Location: PACU  Anesthesia Type: MAC  Level of Consciousness: awake, alert  and patient cooperative  Airway and Oxygen Therapy: Patient Spontanous Breathing and Patient connected to supplemental oxygen  Post-op Assessment: Post-op Vital signs reviewed, Patient's Cardiovascular Status Stable, Respiratory Function Stable, Patent Airway and No signs of Nausea or vomiting  Post-op Vital Signs: Reviewed and stable  Complications: No apparent anesthesia complications

## 2018-08-16 NOTE — Op Note (Signed)
OPERATIVE NOTE  Heidi Barrera [id number] 08/16/2018   PREOPERATIVE DIAGNOSIS:  Nuclear sclerotic cataract left eye. H25.12   POSTOPERATIVE DIAGNOSIS:    Nuclear sclerotic cataract left eye.     PROCEDURE:  Phacoemusification with posterior chamber intraocular lens placement of the left eye   LENS:   Implant Name Type Inv. Item Serial No. Manufacturer Lot No. LRB No. Used Action  LENS IOL DIOP 22.5 - [id number] Intraocular Lens LENS IOL DIOP 22.5 [id number] AMO  Left 1 Implanted        ULTRASOUND TIME: 12  % of 0 minutes 59 seconds, CDE 7.3  SURGEON:  Wyonia Hough, MD   ANESTHESIA:  Topical with tetracaine drops and 2% Xylocaine jelly, augmented with 1% preservative-free intracameral lidocaine.    COMPLICATIONS:  None.   DESCRIPTION OF PROCEDURE:  The patient was identified in the holding room and transported to the operating room and placed in the supine position under the operating microscope.  The left eye was identified as the operative eye and it was prepped and draped in the usual sterile ophthalmic fashion.   A 1 millimeter clear-corneal paracentesis was made at the 1:30 position.  0.5 ml of preservative-free 1% lidocaine was injected into the anterior chamber.  The anterior chamber was filled with Viscoat viscoelastic.  A 2.4 millimeter keratome was used to make a near-clear corneal incision at the 10:30 position.  .  A curvilinear capsulorrhexis was made with a cystotome and capsulorrhexis forceps.  Balanced salt solution was used to hydrodissect and hydrodelineate the nucleus.   Phacoemulsification was then used in stop and chop fashion to remove the lens nucleus and epinucleus.  The remaining cortex was then removed using the irrigation and aspiration handpiece. Provisc was then placed into the capsular bag to distend it for lens placement.  A lens was then injected into the capsular bag.  The remaining viscoelastic was aspirated.   Wounds were hydrated with  balanced salt solution.  The anterior chamber was inflated to a physiologic pressure with balanced salt solution.  No wound leaks were noted. Cefuroxime 0.1 ml of a 10mg /ml solution was injected into the anterior chamber for a dose of 1 mg of intracameral antibiotic at the completion of the case.   Timolol and Brimonidine drops were applied to the eye.  The patient was taken to the recovery room in stable condition without complications of anesthesia or surgery.  [name] 08/16/2018, 10:56 AM

## 2018-08-16 NOTE — H&P (Signed)

## 2018-08-16 NOTE — Anesthesia Procedure Notes (Signed)
Procedure Name: MAC Date/Time: 08/16/2018 10:37 AM Performed by: Cameron Ali, CRNA Pre-anesthesia Checklist: Patient identified, Emergency Drugs available, Suction available, Timeout performed and Patient being monitored Patient Re-evaluated:Patient Re-evaluated prior to induction Oxygen Delivery Method: Nasal cannula Placement Confirmation: positive ETCO2

## 2018-08-17 ENCOUNTER — Encounter: Payer: Self-pay | Admitting: Ophthalmology

## 2018-11-08 ENCOUNTER — Telehealth: Payer: Self-pay | Admitting: *Deleted

## 2018-11-08 DIAGNOSIS — Z122 Encounter for screening for malignant neoplasm of respiratory organs: Secondary | ICD-10-CM

## 2018-11-08 DIAGNOSIS — Z87891 Personal history of nicotine dependence: Secondary | ICD-10-CM

## 2018-11-08 NOTE — Telephone Encounter (Signed)
Left message for patient to notify them that it is time to schedule annual low dose lung cancer screening CT scan. Instructed patient to call back to verify information prior to the scan being scheduled.  

## 2018-11-08 NOTE — Telephone Encounter (Signed)
Patient has been notified that annual lung cancer screening low dose CT scan is due currently or will be in near future. Confirmed that patient is within the age range of 55-77, and asymptomatic, (no signs or symptoms of lung cancer). Patient denies illness that would prevent curative treatment for lung cancer if found. Verified smoking history, (current, 38.5 pack year). The shared decision making visit was done 04/23/15. Patient is agreeable for CT scan being scheduled.

## 2018-11-10 ENCOUNTER — Other Ambulatory Visit: Payer: Self-pay

## 2018-11-10 ENCOUNTER — Ambulatory Visit
Admission: RE | Admit: 2018-11-10 | Discharge: 2018-11-10 | Disposition: A | Discharge status: Home / Self Care | Payer: Medicare Other | Source: Ambulatory Visit | Attending: Oncology | Admitting: Oncology

## 2018-11-10 DIAGNOSIS — Z87891 Personal history of nicotine dependence: Secondary | ICD-10-CM | POA: Insufficient documentation

## 2018-11-10 DIAGNOSIS — Z122 Encounter for screening for malignant neoplasm of respiratory organs: Secondary | ICD-10-CM | POA: Diagnosis present

## 2018-11-16 ENCOUNTER — Encounter: Payer: Self-pay | Admitting: *Deleted

## 2019-03-16 ENCOUNTER — Ambulatory Visit: Payer: Medicare Other | Attending: Internal Medicine

## 2019-03-16 DIAGNOSIS — Z23 Encounter for immunization: Secondary | ICD-10-CM

## 2019-03-16 NOTE — Progress Notes (Signed)
   Covid-19 Vaccination Clinic  Name:  SOFINA YADA    MRN: [id number] DOB: Jan 08, 1951  03/16/2019  Ms. Dooms was observed post Covid-19 immunization for 15 minutes without incidence. She was provided with Vaccine Information Sheet and instruction to access the V-Safe system.   Ms. Cacal was instructed to call 911 with any severe reactions post vaccine: Marland Kitchen Difficulty breathing  . Swelling of your face and throat  . A fast heartbeat  . A bad rash all over your body  . Dizziness and weakness    Immunizations Administered    Name Date Dose VIS Date Route   Pfizer COVID-19 Vaccine 03/16/2019 12:14 PM 0.3 mL 12/29/2018 Intramuscular   Manufacturer: [place]   Lot: [id number]   [place]: [id number]

## 2019-04-11 ENCOUNTER — Ambulatory Visit: Payer: Medicare Other | Attending: Internal Medicine

## 2019-04-11 DIAGNOSIS — Z23 Encounter for immunization: Secondary | ICD-10-CM

## 2019-04-11 NOTE — Progress Notes (Signed)
   Covid-19 Vaccination Clinic  Name:  Heidi Barrera    MRN: [id number] DOB: [birth year]  04/11/2019  [name] was observed post Covid-19 immunization for 15 minutes without incident. She was provided with Vaccine Information Sheet and instruction to access the V-Safe system.   [name] was instructed to call 911 with any severe reactions post vaccine: Marland Kitchen Difficulty breathing  . Swelling of face and throat  . A fast heartbeat  . A bad rash all over body  . Dizziness and weakness   Immunizations Administered    Name Date Dose VIS Date Route   Pfizer COVID-19 Vaccine 04/11/2019  3:57 PM 0.3 mL 12/29/2018 Intramuscular   Manufacturer: [place]   Lot: [id number]   [place]: [id number]

## 2019-06-01 ENCOUNTER — Encounter
Admission: RE | Admit: 2019-06-01 | Discharge: 2019-06-01 | Disposition: A | Discharge status: Home / Self Care | Payer: Medicare Other | Source: Ambulatory Visit | Attending: Otolaryngology | Admitting: Otolaryngology

## 2019-06-01 ENCOUNTER — Other Ambulatory Visit: Payer: Self-pay

## 2019-06-01 NOTE — Patient Instructions (Signed)
Your procedure is scheduled on: 06/05/19 Report to [place]. To find out your arrival time please call [phone] between 1PM - 3PM on 06/04/19.  Remember: Instructions that are not followed completely may result in serious medical risk, up to and including death, or upon the discretion of your surgeon and anesthesiologist your surgery may need to be rescheduled.     _X__ 1. Do not eat food after midnight the night before your procedure.                 No gum chewing or hard candies. You may drink clear liquids up to 2 hours                 before you are scheduled to arrive for your surgery- DO not drink clear                 liquids within 2 hours of the start of your surgery.                 Clear Liquids include:  water, apple juice without pulp, clear carbohydrate                 drink such as Clearfast or Gatorade, Black Coffee or Tea (Do not add                 anything to coffee or tea). Diabetics water only  __X__2.  On the morning of surgery brush your teeth with toothpaste and water, you                 may rinse your mouth with mouthwash if you wish.  Do not swallow any              toothpaste of mouthwash.     _X__ 3.  No Alcohol for 24 hours before or after surgery.   _X__ 4.  Do Not Smoke or use e-cigarettes For 24 Hours Prior to Your Surgery.                 Do not use any chewable tobacco products for at least 6 hours prior to                 surgery.  ____  5.  Bring all medications with you on the day of surgery if instructed.   __X__  6.  Notify your doctor if there is any change in your medical condition      (cold, fever, infections).     Do not wear jewelry, make-up, hairpins, clips or nail polish. Do not wear lotions, powders, or perfumes.  Do not shave 48 hours prior to surgery. Men may shave face and neck. Do not bring valuables to the hospital.    [facility] is not responsible for any belongings or  valuables.  Contacts, dentures/partials or body piercings may not be worn into surgery. Bring a case for your contacts, glasses or hearing aids, a denture cup will be supplied. Leave your suitcase in the car. After surgery it may be brought to your room. For patients admitted to the hospital, discharge time is determined by your treatment team.   Patients discharged the day of surgery will not be allowed to drive home.   Please read over the following fact sheets that you were given:   MRSA Information  __X__ Take these medicines the morning of surgery with A SIP OF WATER:  1. pantoprazole (PROTONIX) 20 MG tablet  2. sertraline (ZOLOFT) 100 MG tablet  3.   4.  5.  6.  ____ Fleet Enema (as directed)   ____ Use CHG Soap/SAGE wipes as directed  ____ Use inhalers on the day of surgery  ____ Stop metformin/Janumet/Farxiga 2 days prior to surgery    ____ Take 1/2 of usual insulin dose the night before surgery. No insulin the morning          of surgery.   ____ Stop Blood Thinners Coumadin/Plavix/Xarelto/Pleta/Pradaxa/Eliquis/Effient/Aspirin  on   Or contact your Surgeon, Cardiologist or Medical Doctor regarding  ability to stop your blood thinners  __X__ Stop Anti-inflammatories 7 days before surgery such as Advil, Ibuprofen, Motrin,  BC or Goodies Powder, Naprosyn, Naproxen, Aleve, Aspirin    __X__ Stop all herbal supplements, fish oil or vitamin E until after surgery.    ____ Bring C-Pap to the hospital.

## 2019-06-04 ENCOUNTER — Encounter
Admission: RE | Admit: 2019-06-04 | Discharge: 2019-06-04 | Disposition: A | Discharge status: Home / Self Care | Payer: Medicare Other | Source: Ambulatory Visit | Attending: Otolaryngology | Admitting: Otolaryngology

## 2019-06-04 ENCOUNTER — Other Ambulatory Visit: Payer: Self-pay

## 2019-06-04 DIAGNOSIS — Z01812 Encounter for preprocedural laboratory examination: Secondary | ICD-10-CM | POA: Insufficient documentation

## 2019-06-04 DIAGNOSIS — Z01810 Encounter for preprocedural cardiovascular examination: Secondary | ICD-10-CM

## 2019-06-04 DIAGNOSIS — Z20822 Contact with and (suspected) exposure to covid-19: Secondary | ICD-10-CM | POA: Diagnosis not present

## 2019-06-04 LAB — SARS CORONAVIRUS 2 (TAT 6-24 HRS): SARS Coronavirus 2: NEGATIVE

## 2019-06-05 ENCOUNTER — Other Ambulatory Visit: Payer: Self-pay

## 2019-06-05 ENCOUNTER — Ambulatory Visit
Admission: RE | Admit: 2019-06-05 | Discharge: 2019-06-05 | Disposition: A | Discharge status: Home / Self Care | Payer: Medicare Other | Attending: Otolaryngology | Admitting: Otolaryngology

## 2019-06-05 ENCOUNTER — Ambulatory Visit: Payer: Medicare Other | Admitting: Anesthesiology

## 2019-06-05 ENCOUNTER — Encounter: Payer: Self-pay | Admitting: Otolaryngology

## 2019-06-05 ENCOUNTER — Encounter
Admission: RE | Disposition: A | Discharge status: Home / Self Care | Payer: Self-pay | Source: Home / Self Care | Attending: Otolaryngology

## 2019-06-05 DIAGNOSIS — F17200 Nicotine dependence, unspecified, uncomplicated: Secondary | ICD-10-CM | POA: Insufficient documentation

## 2019-06-05 DIAGNOSIS — Z79899 Other long term (current) drug therapy: Secondary | ICD-10-CM | POA: Insufficient documentation

## 2019-06-05 DIAGNOSIS — Z7982 Long term (current) use of aspirin: Secondary | ICD-10-CM | POA: Diagnosis not present

## 2019-06-05 DIAGNOSIS — J381 Polyp of vocal cord and larynx: Secondary | ICD-10-CM | POA: Diagnosis present

## 2019-06-05 DIAGNOSIS — F419 Anxiety disorder, unspecified: Secondary | ICD-10-CM | POA: Diagnosis not present

## 2019-06-05 DIAGNOSIS — F329 Major depressive disorder, single episode, unspecified: Secondary | ICD-10-CM | POA: Diagnosis not present

## 2019-06-05 DIAGNOSIS — M1990 Unspecified osteoarthritis, unspecified site: Secondary | ICD-10-CM | POA: Insufficient documentation

## 2019-06-05 DIAGNOSIS — J449 Chronic obstructive pulmonary disease, unspecified: Secondary | ICD-10-CM | POA: Diagnosis not present

## 2019-06-05 DIAGNOSIS — D141 Benign neoplasm of larynx: Secondary | ICD-10-CM | POA: Insufficient documentation

## 2019-06-05 DIAGNOSIS — Z96651 Presence of right artificial knee joint: Secondary | ICD-10-CM | POA: Insufficient documentation

## 2019-06-05 DIAGNOSIS — K219 Gastro-esophageal reflux disease without esophagitis: Secondary | ICD-10-CM | POA: Diagnosis not present

## 2019-06-05 HISTORY — PX: DIRECT LARYNGOSCOPY: SHX5326

## 2019-06-05 SURGERY — LARYNGOSCOPY, DIRECT
Anesthesia: General | Laterality: Bilateral

## 2019-06-05 MED ORDER — EPINEPHRINE PF 1 MG/ML IJ SOLN
INTRAMUSCULAR | Status: AC
  Filled 2019-06-05: qty 1

## 2019-06-05 MED ORDER — REMIFENTANIL HCL 1 MG IV SOLR
INTRAVENOUS | Status: AC
  Filled 2019-06-05: qty 1000

## 2019-06-05 MED ORDER — ACETAMINOPHEN 500 MG PO TABS
1000.0000 mg | ORAL_TABLET | Freq: Four times a day (QID) | ORAL | Status: DC | PRN
  Administered 2019-06-05: 1000 mg via ORAL

## 2019-06-05 MED ORDER — LACTATED RINGERS IV SOLN: INTRAVENOUS | Status: DC

## 2019-06-05 MED ORDER — MIDAZOLAM HCL 2 MG/2ML IJ SOLN
INTRAMUSCULAR | Status: AC
  Filled 2019-06-05: qty 2

## 2019-06-05 MED ORDER — OXYMETAZOLINE HCL 0.05 % NA SOLN
NASAL | Status: AC
  Filled 2019-06-05: qty 30

## 2019-06-05 MED ORDER — HYDROCODONE-ACETAMINOPHEN 7.5-325 MG/15ML PO SOLN: 10.0000 mL | Freq: Four times a day (QID) | ORAL | 0 refills | Status: AC | PRN

## 2019-06-05 MED ORDER — SODIUM CHLORIDE (PF) 0.9 % IJ SOLN
INTRAMUSCULAR | Status: AC
  Filled 2019-06-05: qty 20

## 2019-06-05 MED ORDER — OXYCODONE HCL 5 MG PO TABS: 5.0000 mg | ORAL_TABLET | Freq: Once | ORAL | Status: DC | PRN

## 2019-06-05 MED ORDER — ACETAMINOPHEN 500 MG PO TABS
ORAL_TABLET | ORAL | Status: AC
  Filled 2019-06-05: qty 2

## 2019-06-05 MED ORDER — EPHEDRINE SULFATE 50 MG/ML IJ SOLN
INTRAMUSCULAR | Status: DC | PRN
  Administered 2019-06-05: 15 mg via INTRAVENOUS

## 2019-06-05 MED ORDER — PROPOFOL 10 MG/ML IV BOLUS
INTRAVENOUS | Status: AC
  Filled 2019-06-05: qty 20

## 2019-06-05 MED ORDER — FENTANYL CITRATE (PF) 100 MCG/2ML IJ SOLN
INTRAMUSCULAR | Status: AC
  Filled 2019-06-05: qty 2

## 2019-06-05 MED ORDER — LIDOCAINE HCL (CARDIAC) PF 100 MG/5ML IV SOSY
PREFILLED_SYRINGE | INTRAVENOUS | Status: DC | PRN
  Administered 2019-06-05: 80 mg via INTRAVENOUS

## 2019-06-05 MED ORDER — ONDANSETRON HCL 4 MG/2ML IJ SOLN
INTRAMUSCULAR | Status: AC
  Filled 2019-06-05: qty 2

## 2019-06-05 MED ORDER — ONDANSETRON HCL 4 MG PO TABS: 4.0000 mg | ORAL_TABLET | Freq: Three times a day (TID) | ORAL | 0 refills | Status: DC | PRN

## 2019-06-05 MED ORDER — LIDOCAINE HCL (PF) 2 % IJ SOLN
INTRAMUSCULAR | Status: AC
  Filled 2019-06-05: qty 5

## 2019-06-05 MED ORDER — ONDANSETRON HCL 4 MG/2ML IJ SOLN
INTRAMUSCULAR | Status: DC | PRN
  Administered 2019-06-05: 4 mg via INTRAVENOUS

## 2019-06-05 MED ORDER — DEXAMETHASONE SODIUM PHOSPHATE 10 MG/ML IJ SOLN
INTRAMUSCULAR | Status: AC
  Filled 2019-06-05: qty 1

## 2019-06-05 MED ORDER — SODIUM CHLORIDE FLUSH 0.9 % IV SOLN
INTRAVENOUS | Status: AC
  Filled 2019-06-05: qty 10

## 2019-06-05 MED ORDER — LIDOCAINE HCL (PF) 4 % IJ SOLN
INTRAMUSCULAR | Status: DC | PRN
  Administered 2019-06-05: 4 mL

## 2019-06-05 MED ORDER — REMIFENTANIL HCL 1 MG IV SOLR
INTRAVENOUS | Status: DC | PRN
  Administered 2019-06-05: .2 ug/kg/min via INTRAVENOUS

## 2019-06-05 MED ORDER — MIDAZOLAM HCL 2 MG/2ML IJ SOLN
INTRAMUSCULAR | Status: DC | PRN
  Administered 2019-06-05: 2 mg via INTRAVENOUS

## 2019-06-05 MED ORDER — FENTANYL CITRATE (PF) 100 MCG/2ML IJ SOLN
INTRAMUSCULAR | Status: DC | PRN
  Administered 2019-06-05: 50 ug via INTRAVENOUS

## 2019-06-05 MED ORDER — FENTANYL CITRATE (PF) 100 MCG/2ML IJ SOLN: 25.0000 ug | INTRAMUSCULAR | Status: DC | PRN

## 2019-06-05 MED ORDER — PROPOFOL 10 MG/ML IV BOLUS
INTRAVENOUS | Status: DC | PRN
  Administered 2019-06-05: 120 mg via INTRAVENOUS
  Administered 2019-06-05: 50 mg via INTRAVENOUS

## 2019-06-05 MED ORDER — SUGAMMADEX SODIUM 500 MG/5ML IV SOLN
INTRAVENOUS | Status: DC | PRN
  Administered 2019-06-05: 200 mg via INTRAVENOUS

## 2019-06-05 MED ORDER — ROCURONIUM BROMIDE 100 MG/10ML IV SOLN
INTRAVENOUS | Status: DC | PRN
  Administered 2019-06-05: 40 mg via INTRAVENOUS

## 2019-06-05 MED ORDER — ROCURONIUM BROMIDE 10 MG/ML (PF) SYRINGE
PREFILLED_SYRINGE | INTRAVENOUS | Status: AC
  Filled 2019-06-05: qty 10

## 2019-06-05 MED ORDER — OXYCODONE HCL 5 MG/5ML PO SOLN: 5.0000 mg | Freq: Once | ORAL | Status: DC | PRN

## 2019-06-05 MED ORDER — PROPOFOL 500 MG/50ML IV EMUL
INTRAVENOUS | Status: AC
  Filled 2019-06-05: qty 50

## 2019-06-05 MED ORDER — DEXAMETHASONE SODIUM PHOSPHATE 10 MG/ML IJ SOLN
INTRAMUSCULAR | Status: DC | PRN
  Administered 2019-06-05: 10 mg via INTRAVENOUS

## 2019-06-05 MED ORDER — ONDANSETRON HCL 4 MG/2ML IJ SOLN: 4.0000 mg | Freq: Once | INTRAMUSCULAR | Status: DC | PRN

## 2019-06-05 MED ORDER — PHENYLEPHRINE HCL (PRESSORS) 10 MG/ML IV SOLN
INTRAVENOUS | Status: DC | PRN
  Administered 2019-06-05: 100 ug via INTRAVENOUS

## 2019-06-05 MED ORDER — ACETAMINOPHEN 10 MG/ML IV SOLN: 1000.0000 mg | Freq: Once | INTRAVENOUS | Status: DC | PRN

## 2019-06-05 MED ORDER — PROPOFOL 500 MG/50ML IV EMUL
INTRAVENOUS | Status: DC | PRN
  Administered 2019-06-05: 125 ug/kg/min via INTRAVENOUS

## 2019-06-05 SURGICAL SUPPLY — 18 items
BASIN GRAD PLASTIC 32OZ STRL (MISCELLANEOUS) ×3 IMPLANT
COVER BACK TABLE REUSABLE LG (DRAPES) ×3 IMPLANT
COVER MAYO STAND REUSABLE (DRAPES) ×3 IMPLANT
COVER WAND RF STERILE (DRAPES) ×3 IMPLANT
CUP MEDICINE 2OZ PLAST GRAD ST (MISCELLANEOUS) ×3 IMPLANT
DRSG TELFA 4X3 1S NADH ST (GAUZE/BANDAGES/DRESSINGS) ×3 IMPLANT
GAUZE 4X4 16PLY RFD (DISPOSABLE) ×3 IMPLANT
GLOVE BIO SURGEON STRL SZ7.5 (GLOVE) ×3 IMPLANT
GOWN STRL REUS W/ TWL LRG LVL3 (GOWN DISPOSABLE) ×2 IMPLANT
GOWN STRL REUS W/TWL LRG LVL3 (GOWN DISPOSABLE) ×4
NDL SAFETY ECLIPSE 18X1.5 (NEEDLE) ×1 IMPLANT
NEEDLE HYPO 18GX1.5 SHARP (NEEDLE) ×2
PATTIES SURGICAL .5 X.5 (GAUZE/BANDAGES/DRESSINGS) ×3 IMPLANT
SOL ANTI-FOG 6CC FOG-OUT (MISCELLANEOUS) ×1 IMPLANT
SOL FOG-OUT ANTI-FOG 6CC (MISCELLANEOUS) ×2
TOWEL OR 17X26 4PK STRL BLUE (TOWEL DISPOSABLE) ×3 IMPLANT
TUBING CONNECTING 10 (TUBING) ×2 IMPLANT
TUBING CONNECTING 10' (TUBING) ×1

## 2019-06-05 NOTE — Anesthesia Procedure Notes (Deleted)
Procedure Name: Intubation Date/Time: 06/05/2019 7:50 AM Performed by: Arita Miss, MD Pre-anesthesia Checklist: Patient identified, Patient being monitored, Timeout performed, Emergency Drugs available and Suction available Patient Re-evaluated:Patient Re-evaluated prior to induction Oxygen Delivery Method: Circle system utilized Preoxygenation: Pre-oxygenation with 100% oxygen Induction Type: IV induction Ventilation: Mask ventilation without difficulty Laryngoscope Size: 3 and McGraph Grade View: Grade I Tube type: MLT Tube size: 5.0 mm Number of attempts: 2 Placement Confirmation: ETT inserted through vocal cords under direct vision,  positive ETCO2 and breath sounds checked- equal and bilateral Secured at: 21 cm Tube secured with: Tape Dental Injury: Teeth and Oropharynx as per pre-operative assessment  Difficulty Due To: Difficulty was unanticipated Comments: Initial attempt at intubation with 6.0 MLT. Grade 1 view but edematous polyps behind vocal cords, cuff of tube difficult to pass through. Successful intubation with 5.0 MLT.

## 2019-06-05 NOTE — Progress Notes (Signed)
Patient tolerating ice and ginger ale.

## 2019-06-05 NOTE — Anesthesia Procedure Notes (Addendum)
Procedure Name: Intubation Date/Time: 06/05/2019 7:35 AM Performed by: Doreen Salvage, CRNA Pre-anesthesia Checklist: Patient identified, Patient being monitored, Timeout performed, Emergency Drugs available and Suction available Patient Re-evaluated:Patient Re-evaluated prior to induction Oxygen Delivery Method: Circle system utilized Preoxygenation: Pre-oxygenation with 100% oxygen Induction Type: IV induction Ventilation: Mask ventilation without difficulty Laryngoscope Size: 3 and McGraph Grade View: Grade I Tube type: MLT Tube size: 5.0 mm Number of attempts: 2 Airway Equipment and Method: Stylet Placement Confirmation: ETT inserted through vocal cords under direct vision,  positive ETCO2 and breath sounds checked- equal and bilateral Secured at: 21 cm Tube secured with: Tape Dental Injury: Teeth and Oropharynx as per pre-operative assessment  Difficulty Due To: Difficulty was anticipated Comments: Initial attempt at intubation with 6.0 MLT. Grade 1 view but edematous polyps behind vocal cords, cuff of tube difficult to pass through. Successful intubation with 5.0 MLT.

## 2019-06-05 NOTE — Anesthesia Preprocedure Evaluation (Signed)
Anesthesia Evaluation  Patient identified by MRN, date of birth, ID band Patient awake    Reviewed: Allergy & Precautions, H&P , NPO status , Patient's Chart, lab work & pertinent test results  History of Anesthesia Complications Negative for: history of anesthetic complications  Airway Mallampati: II  TM Distance: >3 FB Neck ROM: full    Dental  (+) Poor Dentition, Missing, Upper Dentures, Dental Advisory Given   Pulmonary neg shortness of breath, COPD, Current Smoker and Patient abstained from smoking.,    Pulmonary exam normal breath sounds clear to auscultation       Cardiovascular Exercise Tolerance: Good (-) angina(-) Past MI and (-) DOE negative cardio ROS Normal cardiovascular exam Rhythm:regular Rate:Normal     Neuro/Psych PSYCHIATRIC DISORDERS Depression negative neurological ROS     GI/Hepatic Neg liver ROS, GERD  Controlled,  Endo/Other  negative endocrine ROS  Renal/GU negative Renal ROS  negative genitourinary   Musculoskeletal  (+) Arthritis ,   Abdominal   Peds  Hematology negative hematology ROS (+)   Anesthesia Other Findings Past Medical History:   Eczema                                                       Shingles                                                     Depression                                                   Arthritis                                                    Hemorrhoids                                                 Past Surgical History:   TONSILLECTOMY                                                 CHOLECYSTECTOMY                                               ABDOMINAL HYSTERECTOMY                                        KNEE SURGERY  THROAT SURGERY                                                Excision of Loose Body Knee                     Right              JOINT REPLACEMENT                                                Comment:RT TKR  BMI    Body Mass Index   21.53 kg/m 2      Reproductive/Obstetrics negative OB ROS                             Anesthesia Physical  Anesthesia Plan  ASA: III  Anesthesia Plan: General   Post-op Pain Management:    Induction: Intravenous  PONV Risk Score and Plan: 4 or greater and Ondansetron, Dexamethasone, TIVA and Propofol infusion  Airway Management Planned: Oral ETT  Additional Equipment: None  Intra-op Plan:   Post-operative Plan: Extubation in OR  Informed Consent: I have reviewed the patients History and Physical, chart, labs and discussed the procedure including the risks, benefits and alternatives for the proposed anesthesia with the patient or authorized representative who has indicated his/her understanding and acceptance.     Dental Advisory Given  Plan Discussed with: CRNA and Surgeon  Anesthesia Plan Comments: (Patient with hoarseness of voice for last year. Denies any respiratory compromise or dysphagia.  Discussed risks of anesthesia with patient, including PONV, sore throat, lip/dental damage. Rare risks discussed as well, such as cardiorespiratory and neurological sequelae. Patient understands.)        Anesthesia Quick Evaluation

## 2019-06-05 NOTE — Op Note (Signed)
...  06/05/2019  8:06 AM    Marlowe Sax  [id number]   Pre-Op Dx:  Bilateral true vocal fold lesions  Post-op Dx: same  Proc: Suspension Microlaryngoscopy with bilateral microflap excision   Surg:  Jeannie Fend Delfina Schreurs  Anes:  GOT  EBL:  <59ml  Comp:  none  Findings:  Bilateral polypoid masses encompassing entire bilateral true vocal folds successfully removed.  Procedure: After the patient was identified in holding and the history and physical and consent was reviewed, the patient was taken to the operating room and placed in a supine position. General endotracheal anesthesia was induced in the normal fashion with a McGraff.  At this time, the patient was rotated 90 degrees and a shoulder roll was placed as well as well as a wettened gauze as patient was edentulous.   At this time,Dedo laryngoscope was inserted into the patient's oral cavity. Visualization of the oral cavity, oropharynx, pharynx and larynx was made. This demonstrated a bilateral polypoid masses emanating from the true vocal folds bilaterally prolapsing into the patient's airway.  Epi soaked pledgets were placed on top of the bilateral masses for 1 minute.  The operating microscope was brought onto the field.  Under high powered magnification, a sickle knife was used to incise the right polypoid mass at junction of true and false vocal fold.  Careful suction was used to dissect the overlying mucosa and the polypoid mass from the lamina propria.  Excess mucosa and the polyp were trimmed for a straight vocal margin.  This process was repeated on the left side in an identical fashion.  Care was taken to only dissect in the plane of the lamina propria and avoid trauma to the underlying vocalis muscle.   Epi pledgets were placed for hemostasis.    The patient's entire airway was next evaluated for hemostasis and meticulous suctioning was performed. 0.25ccs of 4% lidocaine was sprayed onto the epiglottis and larynx. The  patient was released from suspension and the mouth gag and laryngoscope was removed from the patient's oral cavity without injury to teeth, lips, or gums.   Dispo:   To PACU in good condition  Plan:  Soft Diet, follow up in 1 week for repeat evaluation and review of pathology.  Voice rest for 5 days and no smoking.  Tiarah Shisler  06/05/2019 8:06 AM

## 2019-06-05 NOTE — H&P (Signed)
..  History and Physical paper copy reviewed and updated date of procedure and will be scanned into system.  Patient seen and examined.  

## 2019-06-05 NOTE — Anesthesia Postprocedure Evaluation (Signed)
Anesthesia Post Note  Patient: Heidi Barrera  Procedure(s) Performed: SUSPENSION MICRODIRECT LARYNGOSCOPY (Bilateral )  Patient location during evaluation: PACU Anesthesia Type: General Level of consciousness: awake and alert Pain management: pain level controlled Vital Signs Assessment: post-procedure vital signs reviewed and stable Respiratory status: spontaneous breathing, nonlabored ventilation, respiratory function stable and patient connected to nasal cannula oxygen Cardiovascular status: blood pressure returned to baseline and stable Postop Assessment: no apparent nausea or vomiting Anesthetic complications: no     Last Vitals:  Vitals:   06/05/19 0852 06/05/19 0914  BP: 129/62 (!) 130/58  Pulse: 80 73  Resp: 16 16  Temp: (!) 36.2 C   SpO2: 100% 99%    Last Pain:  Vitals:   06/05/19 0914  TempSrc:   PainSc: 8                  Arita Miss

## 2019-06-05 NOTE — Discharge Instructions (Signed)

## 2019-06-05 NOTE — Transfer of Care (Signed)
Immediate Anesthesia Transfer of Care Note  Patient: Heidi Barrera  Procedure(s) Performed: Procedure(s): SUSPENSION MICRODIRECT LARYNGOSCOPY (Bilateral)  Patient Location: PACU  Anesthesia Type:General  Level of Consciousness: sedated  Airway & Oxygen Therapy: Patient Spontanous Breathing and Patient connected to face mask oxygen  Post-op Assessment: Report given to RN and Post -op Vital signs reviewed and stable  Post vital signs: Reviewed and stable  Last Vitals:  Vitals:   06/05/19 0614 06/05/19 0825  BP: 121/61 (!) 118/50  Pulse: 91 95  Resp: 16 (!) 28  Temp: (!) 36.1 C 36.4 C  SpO2: [id number]    Complications: No apparent anesthesia complications

## 2019-06-06 LAB — SURGICAL PATHOLOGY

## 2019-11-06 ENCOUNTER — Other Ambulatory Visit: Payer: Self-pay | Admitting: *Deleted

## 2019-11-06 DIAGNOSIS — Z122 Encounter for screening for malignant neoplasm of respiratory organs: Secondary | ICD-10-CM

## 2019-11-06 DIAGNOSIS — Z87891 Personal history of nicotine dependence: Secondary | ICD-10-CM

## 2019-11-06 NOTE — Progress Notes (Signed)
Current smoker, 34 pack year

## 2019-11-15 ENCOUNTER — Other Ambulatory Visit: Payer: Self-pay

## 2019-11-15 ENCOUNTER — Ambulatory Visit
Admission: RE | Admit: 2019-11-15 | Discharge: 2019-11-15 | Disposition: A | Discharge status: Home / Self Care | Payer: Medicare Other | Source: Ambulatory Visit | Attending: Nurse Practitioner | Admitting: Nurse Practitioner

## 2019-11-15 DIAGNOSIS — Z87891 Personal history of nicotine dependence: Secondary | ICD-10-CM | POA: Diagnosis not present

## 2019-11-15 DIAGNOSIS — Z122 Encounter for screening for malignant neoplasm of respiratory organs: Secondary | ICD-10-CM | POA: Diagnosis present

## 2019-11-18 ENCOUNTER — Encounter: Payer: Self-pay | Admitting: *Deleted

## 2021-02-22 IMAGING — CT CT CHEST LUNG CANCER SCREENING LOW DOSE W/O CM
2 of 5 series · 15 of 40 positions shown, 18 images · non-contrast
Comparison: 11/10/2018 screening chest CT.

CLINICAL DATA: 68-year-old asymptomatic female current smoker with
39 pack-year smoking history.

EXAM:
CT CHEST WITHOUT CONTRAST LOW-DOSE FOR LUNG CANCER SCREENING
TECHNIQUE: Multidetector CT imaging of the chest was performed following the
standard protocol without IV contrast.

[Series 3: lung 1.00 · axial · 0.56mm/px · z∈[-1133,-885]mm · 12 of 276 slices shown, 15 images]
[im 14/276  mediastinal]
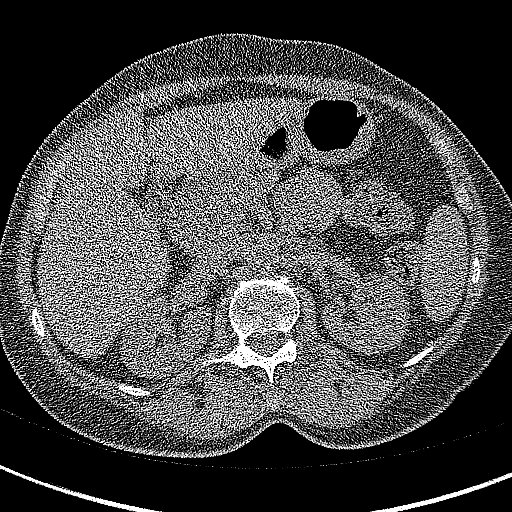
[im 14/276  lung]
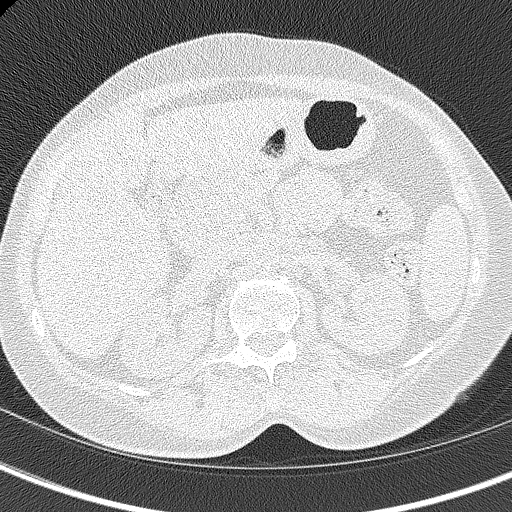
[im 40/276  lung]
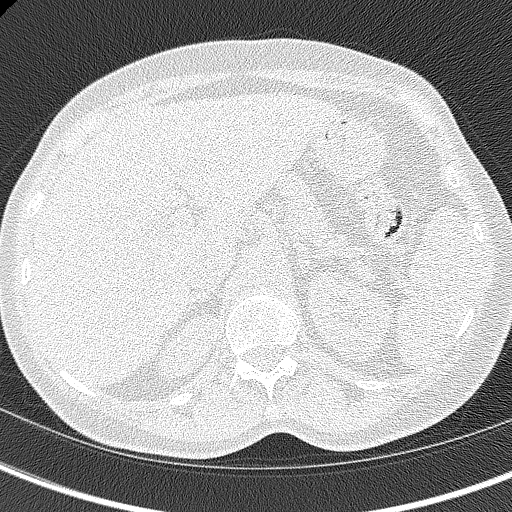
[im 66/276  lung]
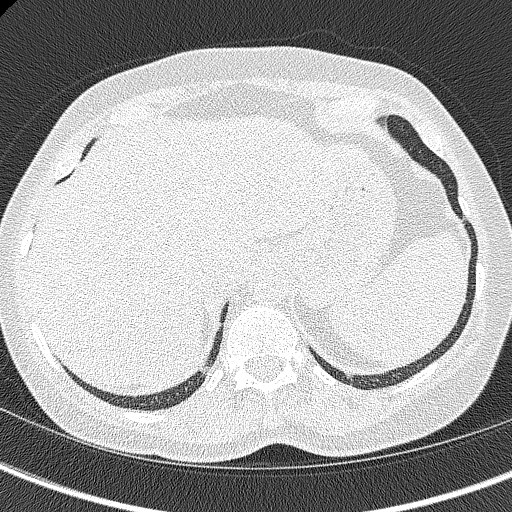
[im 79/276  lung]
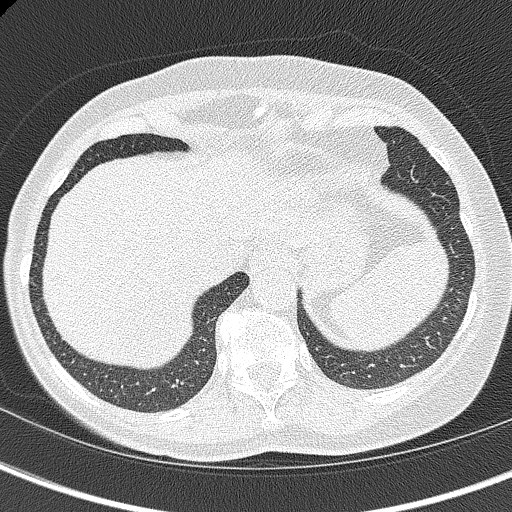
[im 105/276  mediastinal]
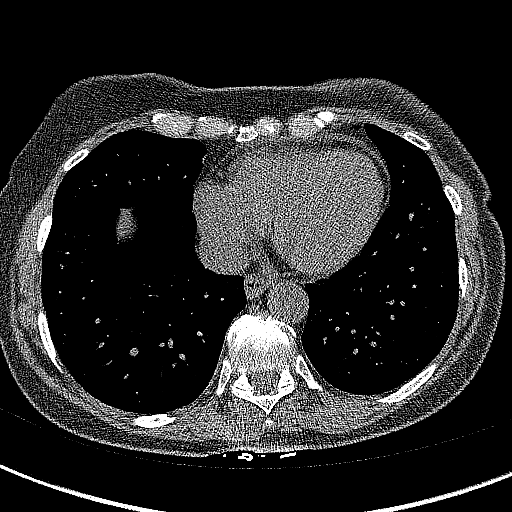
[im 105/276  lung]
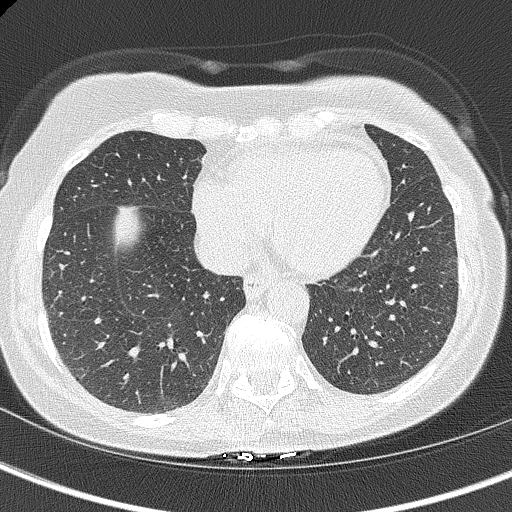
[im 131/276  lung]
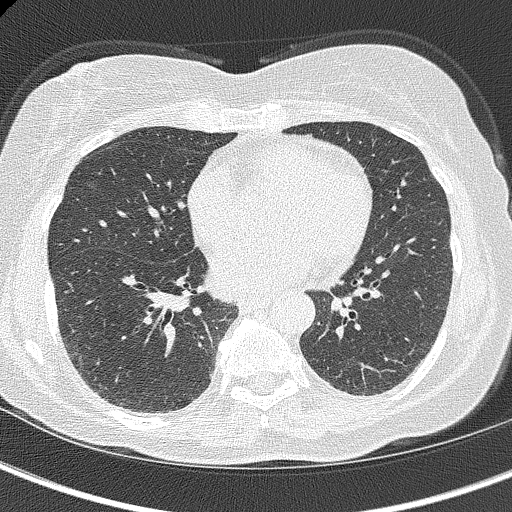
[im 145/276  lung]
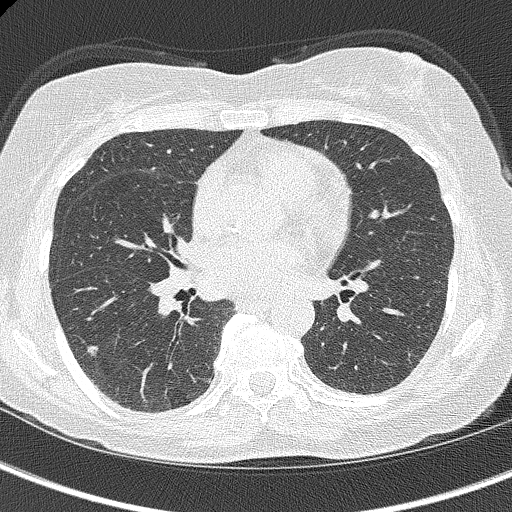
[im 171/276  lung]
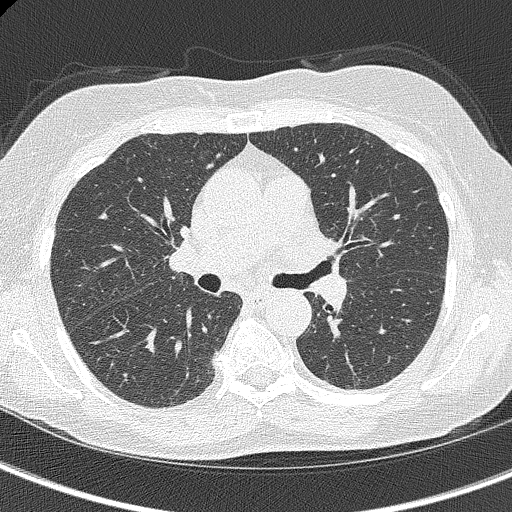
[im 197/276  mediastinal]
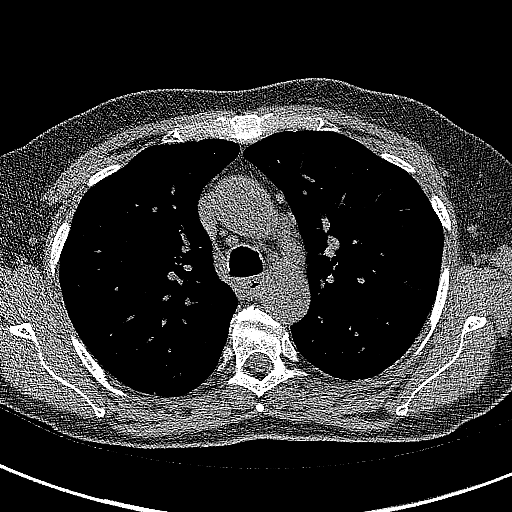
[im 197/276  lung]
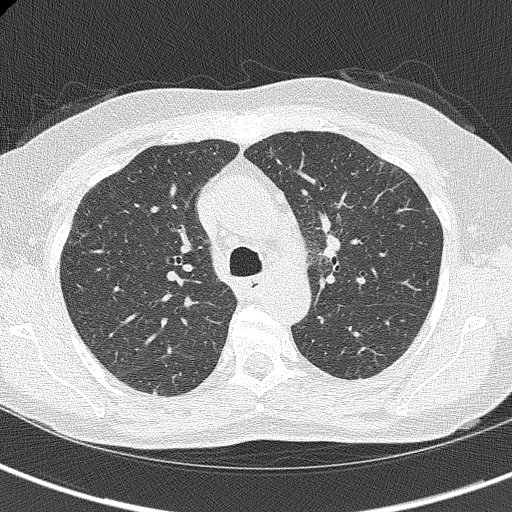
[im 210/276  lung]
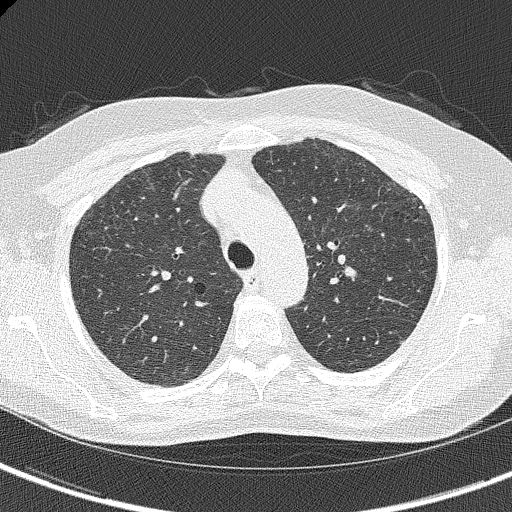
[im 236/276  lung]
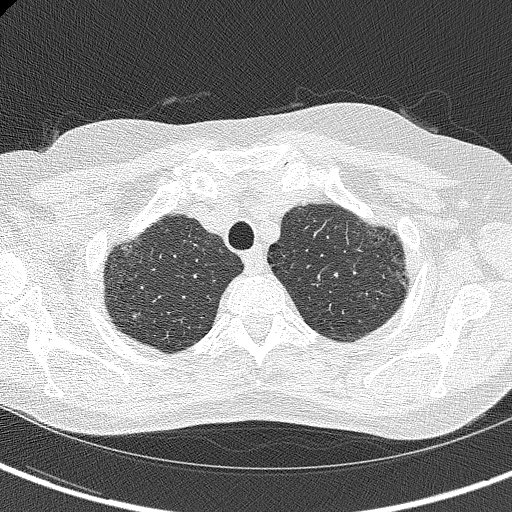
[im 262/276  lung]
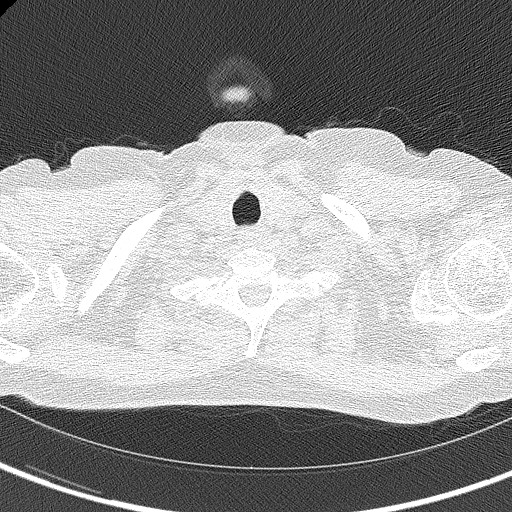

[Series 4: coronals lung 1.00 cor · coronal · 0.54mm/px · 3 of 237 slices shown]
[im 48/237  lung]
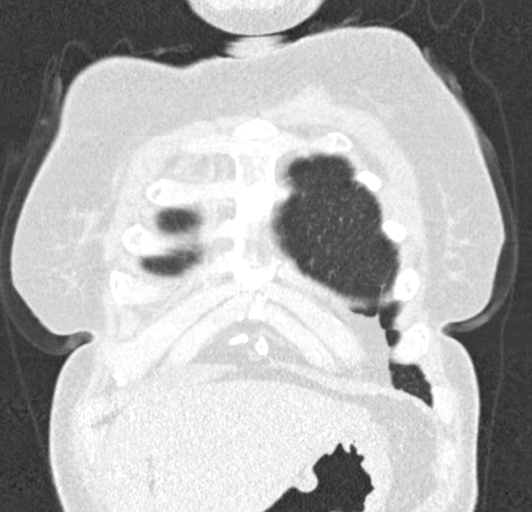
[im 95/237  lung]
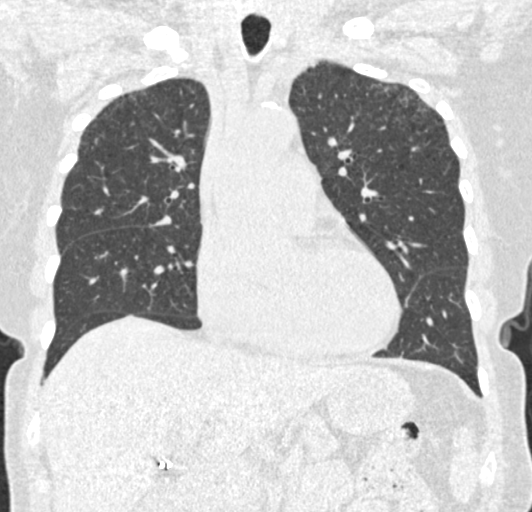
[im 142/237  lung]
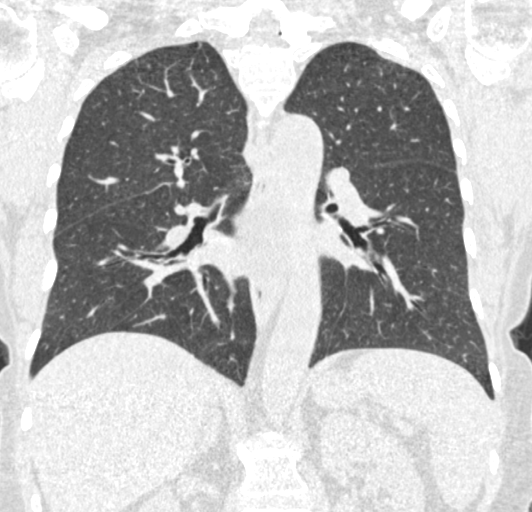

[15 of 40 positions shown; findings below may reference images not displayed]

FINDINGS: Cardiovascular: Normal heart size. No significant pericardial
effusion/thickening. Left anterior descending coronary
atherosclerosis. Atherosclerotic nonaneurysmal thoracic aorta.
Normal caliber pulmonary arteries.

Mediastinum/Nodes: No discrete thyroid nodules. Unremarkable
esophagus. No pathologically enlarged axillary, mediastinal or hilar
lymph nodes, noting limited sensitivity for the detection of hilar
adenopathy on this noncontrast study.

Lungs/Pleura: No pneumothorax. No pleural effusion. Moderate
centrilobular and paraseptal emphysema. No acute consolidative
airspace disease or lung masses. No significant growth of scattered
previously visualized pulmonary nodules. No new significant
pulmonary nodules.

Upper abdomen: Cholecystectomy. Simple 1.2 cm peripheral right liver
cyst. Hypodense 6.4 cm posterior right liver mass, characterized as
a benign hemangioma on 05/27/2015 MRI abdomen study.

Musculoskeletal: No aggressive appearing focal osseous lesions.
Moderate thoracic spondylosis.
IMPRESSION: 1. Lung-RADS 2, benign appearance or behavior. Continue annual
screening with low-dose chest CT without contrast in 12 months.
2. One vessel coronary atherosclerosis.
3. Aortic Atherosclerosis (VXWRK-N0M.M) and Emphysema (VXWRK-VB4.G).

## 2021-03-31 ENCOUNTER — Other Ambulatory Visit: Payer: Self-pay

## 2021-03-31 ENCOUNTER — Telehealth: Payer: Self-pay | Admitting: Acute Care

## 2021-03-31 DIAGNOSIS — Z87891 Personal history of nicotine dependence: Secondary | ICD-10-CM

## 2021-03-31 DIAGNOSIS — F17210 Nicotine dependence, cigarettes, uncomplicated: Secondary | ICD-10-CM

## 2021-03-31 NOTE — Telephone Encounter (Signed)
Left voicemail and call back number for patient to call to schedule annual LDCT ?

## 2021-04-15 ENCOUNTER — Other Ambulatory Visit: Payer: Self-pay

## 2021-04-15 ENCOUNTER — Ambulatory Visit
Admission: RE | Admit: 2021-04-15 | Discharge: 2021-04-15 | Disposition: A | Discharge status: Home / Self Care | Payer: Medicare Other | Source: Ambulatory Visit | Attending: Acute Care | Admitting: Acute Care

## 2021-04-15 DIAGNOSIS — Z87891 Personal history of nicotine dependence: Secondary | ICD-10-CM | POA: Insufficient documentation

## 2021-04-15 DIAGNOSIS — F17210 Nicotine dependence, cigarettes, uncomplicated: Secondary | ICD-10-CM | POA: Diagnosis present

## 2021-04-16 ENCOUNTER — Other Ambulatory Visit: Payer: Self-pay

## 2021-04-16 DIAGNOSIS — Z87891 Personal history of nicotine dependence: Secondary | ICD-10-CM

## 2021-04-16 DIAGNOSIS — F17210 Nicotine dependence, cigarettes, uncomplicated: Secondary | ICD-10-CM

## 2021-05-01 ENCOUNTER — Other Ambulatory Visit: Payer: Self-pay | Admitting: Surgery

## 2021-05-06 ENCOUNTER — Encounter
Admission: RE | Admit: 2021-05-06 | Discharge: 2021-05-06 | Disposition: A | Discharge status: Home / Self Care | Payer: Medicare Other | Source: Ambulatory Visit | Attending: Surgery | Admitting: Surgery

## 2021-05-06 VITALS — BP 124/88 | HR 69 | Temp 98.0°F | Resp 22 | Ht <= 58 in | Wt 111.8 lb

## 2021-05-06 DIAGNOSIS — Z01818 Encounter for other preprocedural examination: Secondary | ICD-10-CM | POA: Diagnosis present

## 2021-05-06 DIAGNOSIS — Z01812 Encounter for preprocedural laboratory examination: Secondary | ICD-10-CM

## 2021-05-06 LAB — URINALYSIS, ROUTINE W REFLEX MICROSCOPIC
Bilirubin Urine: NEGATIVE
Glucose, UA: NEGATIVE mg/dL
Hgb urine dipstick: NEGATIVE
Ketones, ur: NEGATIVE mg/dL
Leukocytes,Ua: NEGATIVE
Nitrite: NEGATIVE
Protein, ur: NEGATIVE mg/dL
Specific Gravity, Urine: 1.001 — ABNORMAL LOW (ref 1.005–1.030)
pH: 7 (ref 5.0–8.0)

## 2021-05-06 LAB — TYPE AND SCREEN
ABO/RH(D): O POS
Antibody Screen: NEGATIVE

## 2021-05-06 LAB — SURGICAL PCR SCREEN
MRSA, PCR: NEGATIVE
Staphylococcus aureus: NEGATIVE

## 2021-05-06 NOTE — Patient Instructions (Signed)
?Your procedure is scheduled on: Thursday May 14, 2021. ?Report to Day Surgery inside [facility] 2nd floor, stop by admissions desk before getting on elevator.  ?To find out your arrival time please call [phone] between 1PM - 3PM on Wednesday May 13, 2021. ? ?Remember: Instructions that are not followed completely may result in serious medical risk,  ?up to and including death, or upon the discretion of your surgeon and anesthesiologist your  ?surgery may need to be rescheduled.  ? ?  _X__ 1. Do not eat food after midnight the night before your procedure. ?                No chewing gum or hard candies. You may drink clear liquids up to 2 hours ?                before you are scheduled to arrive for your surgery- DO not drink clear ?                liquids within 2 hours of the start of your surgery. ?                Clear Liquids include:  water, apple juice without pulp, clear Gatorade, G2 or  ?                Gatorade Zero (avoid Red/Purple/Blue), Black Coffee or Tea (Do not add ?                anything to coffee or tea). ? ?__X__2.   Complete the "Ensure Clear Pre-surgery Clear Carbohydrate Drink" provided to you, 2 hours before arrival. **If you are diabetic you will be provided with an alternative drink, Gatorade Zero or G2. ? ?__X__3.  On the morning of surgery brush your teeth with toothpaste and water, you ?               may rinse your mouth with mouthwash if you wish.  Do not swallow any toothpaste of mouthwash. ?   ? _X__ 4.  No Alcohol for 24 hours before or after surgery. ? ? _X__ 5.  Do Not Smoke or use e-cigarettes For 24 Hours Prior to Your Surgery. ?                Do not use any chewable tobacco products for at least 6 hours prior to ?                Surgery. ? ?_X__  6.  Do not use any recreational drugs (marijuana, cocaine, heroin, ecstasy, MDMA or other) ?               For at least one week prior to your surgery.  Combination of these drugs with anesthesia ?                May have life threatening results. ? ?____  7.  Bring all medications with you on the day of surgery if instructed.  ? ?__X__8.  Notify your doctor if there is any change in your medical condition  ?    (cold, fever, infections). ?    ?Do not wear jewelry, make-up, hairpins, clips or nail polish. ?Do not wear lotions, powders, or perfumes. You may wear deodorant. ?Do not shave 48 hours prior to surgery. Men may shave face and neck. ?Do not bring valuables to the hospital.   ? ?[facility] is not responsible for any belongings or valuables. ? ?Contacts,  dentures or bridgework may not be worn into surgery. ?Leave your suitcase in the car. After surgery it may be brought to your room. ?For patients admitted to the hospital, discharge time is determined by your ?treatment team. ?  ?Patients discharged the day of surgery will not be allowed to drive home.   ?Make arrangements for someone to be with you for the first 24 hours of your ?Same Day Discharge. ? ?  ?Please read over the following fact sheets that you were given:  ? Total Joint Packet  ? ? ?__X__ Take these medicines the morning of surgery with A SIP OF WATER:  ? ? 1. pantoprazole (PROTONIX) 20 MG ? 2. sertraline (ZOLOFT) 100 MG ? 3.  ? 4. ? 5. ? 6. ? ?____ Fleet Enema (as directed)  ? ?__X__ Use CHG Soap (or wipes) as directed ? ?____ Use Benzoyl Peroxide Gel as instructed ? ?____ Use inhalers on the day of surgery ? ?____ Stop metformin 2 days prior to surgery   ? ?____ Take 1/2 of usual insulin dose the night before surgery. No insulin the morning ?         of surgery.  ? ?____ Call your PCP, cardiologist, or Pulmonologist if taking Coumadin/Plavix/aspirin and ask when to stop before your surgery.  ? ?__X__ One Week prior to surgery- Stop Anti-inflammatories such as Ibuprofen, Aleve, Advil, Motrin, meloxicam (MOBIC), diclofenac, etodolac, ketorolac, Toradol, Daypro, piroxicam, and aspirin based products like Goody's or BC powders. OK TO USE  TYLENOL IF NEEDED ?  ?__X__ One week prior to surgery- stop ALL supplements until after surgery.   ? ?____ Bring C-Pap to the hospital.  ? ? ?If you have any questions regarding your pre-procedure instructions,  ?Please call Pre-admit Testing at [phone] ?

## 2021-05-13 MED ORDER — ORAL CARE MOUTH RINSE: 15.0000 mL | Freq: Once | OROMUCOSAL | Status: AC

## 2021-05-13 MED ORDER — CHLORHEXIDINE GLUCONATE 0.12 % MT SOLN
15.0000 mL | Freq: Once | OROMUCOSAL | Status: AC
Start: 2021-05-13 — End: 2021-05-14
  Administered 2021-05-14: 15 mL via OROMUCOSAL

## 2021-05-13 MED ORDER — LACTATED RINGERS IV SOLN: INTRAVENOUS | Status: DC

## 2021-05-13 MED ORDER — CEFAZOLIN SODIUM-DEXTROSE 2-4 GM/100ML-% IV SOLN
2.0000 g | INTRAVENOUS | Status: AC
  Administered 2021-05-14: 2 g via INTRAVENOUS

## 2021-05-14 ENCOUNTER — Ambulatory Visit: Payer: Medicare Other | Admitting: Anesthesiology

## 2021-05-14 ENCOUNTER — Ambulatory Visit
Admission: RE | Admit: 2021-05-14 | Discharge: 2021-05-14 | Disposition: A | Discharge status: Home / Self Care | Payer: Medicare Other | Attending: Surgery | Admitting: Surgery

## 2021-05-14 ENCOUNTER — Other Ambulatory Visit: Payer: Self-pay

## 2021-05-14 ENCOUNTER — Encounter: Payer: Self-pay | Admitting: Surgery

## 2021-05-14 ENCOUNTER — Ambulatory Visit: Payer: Medicare Other

## 2021-05-14 ENCOUNTER — Encounter
Admission: RE | Disposition: A | Discharge status: Home / Self Care | Payer: Self-pay | Source: Home / Self Care | Attending: Surgery

## 2021-05-14 DIAGNOSIS — Z96642 Presence of left artificial hip joint: Secondary | ICD-10-CM | POA: Insufficient documentation

## 2021-05-14 DIAGNOSIS — J449 Chronic obstructive pulmonary disease, unspecified: Secondary | ICD-10-CM | POA: Diagnosis not present

## 2021-05-14 DIAGNOSIS — M1611 Unilateral primary osteoarthritis, right hip: Secondary | ICD-10-CM | POA: Diagnosis present

## 2021-05-14 DIAGNOSIS — K219 Gastro-esophageal reflux disease without esophagitis: Secondary | ICD-10-CM | POA: Diagnosis not present

## 2021-05-14 DIAGNOSIS — F17210 Nicotine dependence, cigarettes, uncomplicated: Secondary | ICD-10-CM | POA: Diagnosis not present

## 2021-05-14 HISTORY — PX: TOTAL HIP ARTHROPLASTY: SHX124

## 2021-05-14 SURGERY — ARTHROPLASTY, HIP, TOTAL,POSTERIOR APPROACH
Anesthesia: Spinal | Site: Hip | Laterality: Right

## 2021-05-14 MED ORDER — CEFAZOLIN SODIUM-DEXTROSE 2-4 GM/100ML-% IV SOLN
INTRAVENOUS | Status: AC
  Administered 2021-05-14: 2 g via INTRAVENOUS
  Filled 2021-05-14: qty 100

## 2021-05-14 MED ORDER — PHENYLEPHRINE HCL (PRESSORS) 10 MG/ML IV SOLN
INTRAVENOUS | Status: AC
  Filled 2021-05-14: qty 1

## 2021-05-14 MED ORDER — SODIUM CHLORIDE 0.9 % BOLUS PEDS
250.0000 mL | Freq: Once | INTRAVENOUS | Status: AC
Start: 2021-05-14 — End: 2021-05-14
  Administered 2021-05-14: 250 mL via INTRAVENOUS

## 2021-05-14 MED ORDER — BUPIVACAINE-EPINEPHRINE (PF) 0.5% -1:200000 IJ SOLN
INTRAMUSCULAR | Status: DC | PRN
  Administered 2021-05-14: 30 mL

## 2021-05-14 MED ORDER — OXYCODONE HCL 5 MG PO TABS: 5.0000 mg | ORAL_TABLET | ORAL | 0 refills | Status: AC | PRN

## 2021-05-14 MED ORDER — CEFAZOLIN SODIUM-DEXTROSE 2-4 GM/100ML-% IV SOLN: 2.0000 g | Freq: Four times a day (QID) | INTRAVENOUS | Status: DC

## 2021-05-14 MED ORDER — OXYCODONE HCL 5 MG PO TABS
5.0000 mg | ORAL_TABLET | ORAL | Status: DC | PRN
  Administered 2021-05-14: 5 mg via ORAL

## 2021-05-14 MED ORDER — ACETAMINOPHEN 500 MG PO TABS
1000.0000 mg | ORAL_TABLET | Freq: Four times a day (QID) | ORAL | Status: DC
  Administered 2021-05-14: 1000 mg via ORAL

## 2021-05-14 MED ORDER — SODIUM CHLORIDE 0.9 % IV SOLN
INTRAVENOUS | Status: DC | PRN
  Administered 2021-05-14: 60 mL

## 2021-05-14 MED ORDER — FENTANYL CITRATE (PF) 100 MCG/2ML IJ SOLN
INTRAMUSCULAR | Status: DC | PRN
Start: 2021-05-14 — End: 2021-05-14
  Administered 2021-05-14 (×2): 50 ug via INTRAVENOUS

## 2021-05-14 MED ORDER — OXYCODONE HCL 5 MG/5ML PO SOLN: 5.0000 mg | Freq: Once | ORAL | Status: DC | PRN

## 2021-05-14 MED ORDER — PHENYLEPHRINE 80 MCG/ML (10ML) SYRINGE FOR IV PUSH (FOR BLOOD PRESSURE SUPPORT)
PREFILLED_SYRINGE | INTRAVENOUS | Status: AC
  Filled 2021-05-14: qty 10

## 2021-05-14 MED ORDER — VASOPRESSIN 20 UNIT/ML IV SOLN
INTRAVENOUS | Status: DC | PRN
Start: 2021-05-14 — End: 2021-05-14
  Administered 2021-05-14 (×2): 2 [IU] via INTRAVENOUS

## 2021-05-14 MED ORDER — OXYCODONE HCL 5 MG PO TABS
ORAL_TABLET | ORAL | Status: AC
  Filled 2021-05-14: qty 1

## 2021-05-14 MED ORDER — SODIUM CHLORIDE 0.9 % IV SOLN: INTRAVENOUS | Status: DC

## 2021-05-14 MED ORDER — PROPOFOL 10 MG/ML IV BOLUS
INTRAVENOUS | Status: AC
  Filled 2021-05-14: qty 40

## 2021-05-14 MED ORDER — BUPIVACAINE HCL (PF) 0.5 % IJ SOLN
INTRAMUSCULAR | Status: DC | PRN
  Administered 2021-05-14: 2.8 mL

## 2021-05-14 MED ORDER — MIDAZOLAM HCL 5 MG/5ML IJ SOLN
INTRAMUSCULAR | Status: DC | PRN
  Administered 2021-05-14 (×2): 1 mg via INTRAVENOUS

## 2021-05-14 MED ORDER — 0.9 % SODIUM CHLORIDE (POUR BTL) OPTIME
TOPICAL | Status: DC | PRN
  Administered 2021-05-14: 1000 mL

## 2021-05-14 MED ORDER — TRANEXAMIC ACID 1000 MG/10ML IV SOLN
INTRAVENOUS | Status: DC | PRN
  Administered 2021-05-14: 1000 mg via TOPICAL

## 2021-05-14 MED ORDER — ACETAMINOPHEN 10 MG/ML IV SOLN
INTRAVENOUS | Status: AC
  Filled 2021-05-14: qty 100

## 2021-05-14 MED ORDER — DEXAMETHASONE SODIUM PHOSPHATE 10 MG/ML IJ SOLN
INTRAMUSCULAR | Status: DC | PRN
  Administered 2021-05-14: 10 mg via INTRAVENOUS

## 2021-05-14 MED ORDER — METOCLOPRAMIDE HCL 5 MG/ML IJ SOLN: 5.0000 mg | Freq: Three times a day (TID) | INTRAMUSCULAR | Status: DC | PRN

## 2021-05-14 MED ORDER — METOCLOPRAMIDE HCL 10 MG PO TABS: 5.0000 mg | ORAL_TABLET | Freq: Three times a day (TID) | ORAL | Status: DC | PRN

## 2021-05-14 MED ORDER — ACETAMINOPHEN 500 MG PO TABS
ORAL_TABLET | ORAL | Status: AC
  Filled 2021-05-14: qty 2

## 2021-05-14 MED ORDER — CEFAZOLIN SODIUM-DEXTROSE 2-4 GM/100ML-% IV SOLN
INTRAVENOUS | Status: AC
  Filled 2021-05-14: qty 100

## 2021-05-14 MED ORDER — FENTANYL CITRATE (PF) 100 MCG/2ML IJ SOLN
INTRAMUSCULAR | Status: AC
  Filled 2021-05-14: qty 2

## 2021-05-14 MED ORDER — PROPOFOL 1000 MG/100ML IV EMUL
INTRAVENOUS | Status: AC
  Filled 2021-05-14: qty 100

## 2021-05-14 MED ORDER — KETOROLAC TROMETHAMINE 15 MG/ML IJ SOLN: 7.5000 mg | Freq: Four times a day (QID) | INTRAMUSCULAR | Status: DC

## 2021-05-14 MED ORDER — PROPOFOL 500 MG/50ML IV EMUL
INTRAVENOUS | Status: DC | PRN
Start: 2021-05-14 — End: 2021-05-14
  Administered 2021-05-14: 75 ug/kg/min via INTRAVENOUS

## 2021-05-14 MED ORDER — KETOROLAC TROMETHAMINE 15 MG/ML IJ SOLN
15.0000 mg | Freq: Once | INTRAMUSCULAR | Status: AC
  Administered 2021-05-14: 15 mg via INTRAVENOUS

## 2021-05-14 MED ORDER — BUPIVACAINE LIPOSOME 1.3 % IJ SUSP
INTRAMUSCULAR | Status: AC
  Filled 2021-05-14: qty 20

## 2021-05-14 MED ORDER — CHLORHEXIDINE GLUCONATE 0.12 % MT SOLN
OROMUCOSAL | Status: AC
  Filled 2021-05-14: qty 15

## 2021-05-14 MED ORDER — OXYCODONE HCL 5 MG PO TABS: 5.0000 mg | ORAL_TABLET | Freq: Once | ORAL | Status: DC | PRN

## 2021-05-14 MED ORDER — SODIUM CHLORIDE 0.9 % IR SOLN
Status: DC | PRN
  Administered 2021-05-14: 3000 mL

## 2021-05-14 MED ORDER — PHENYLEPHRINE HCL-NACL 20-0.9 MG/250ML-% IV SOLN
INTRAVENOUS | Status: DC | PRN
  Administered 2021-05-14: 25 ug/min via INTRAVENOUS

## 2021-05-14 MED ORDER — KETOROLAC TROMETHAMINE 15 MG/ML IJ SOLN
INTRAMUSCULAR | Status: AC
  Filled 2021-05-14: qty 1

## 2021-05-14 MED ORDER — ONDANSETRON HCL 4 MG PO TABS: 4.0000 mg | ORAL_TABLET | Freq: Four times a day (QID) | ORAL | Status: DC | PRN

## 2021-05-14 MED ORDER — FENTANYL CITRATE (PF) 100 MCG/2ML IJ SOLN: 25.0000 ug | INTRAMUSCULAR | Status: DC | PRN

## 2021-05-14 MED ORDER — ONDANSETRON HCL 4 MG/2ML IJ SOLN
INTRAMUSCULAR | Status: DC | PRN
Start: 2021-05-14 — End: 2021-05-14
  Administered 2021-05-14: 4 mg via INTRAVENOUS

## 2021-05-14 MED ORDER — PHENYLEPHRINE 80 MCG/ML (10ML) SYRINGE FOR IV PUSH (FOR BLOOD PRESSURE SUPPORT)
PREFILLED_SYRINGE | INTRAVENOUS | Status: DC | PRN
  Administered 2021-05-14: 40 ug via INTRAVENOUS
  Administered 2021-05-14: 80 ug via INTRAVENOUS

## 2021-05-14 MED ORDER — GLYCOPYRROLATE 0.2 MG/ML IJ SOLN
INTRAMUSCULAR | Status: DC | PRN
  Administered 2021-05-14: .2 mg via INTRAVENOUS

## 2021-05-14 MED ORDER — ONDANSETRON HCL 4 MG/2ML IJ SOLN: 4.0000 mg | Freq: Four times a day (QID) | INTRAMUSCULAR | Status: DC | PRN

## 2021-05-14 MED ORDER — EPHEDRINE SULFATE (PRESSORS) 50 MG/ML IJ SOLN
INTRAMUSCULAR | Status: DC | PRN
  Administered 2021-05-14: 5 mg via INTRAVENOUS

## 2021-05-14 MED ORDER — APIXABAN 2.5 MG PO TABS: 2.5000 mg | ORAL_TABLET | Freq: Two times a day (BID) | ORAL | 0 refills | Status: AC

## 2021-05-14 MED ORDER — SODIUM CHLORIDE FLUSH 0.9 % IV SOLN
INTRAVENOUS | Status: AC
  Filled 2021-05-14: qty 40

## 2021-05-14 MED ORDER — BUPIVACAINE-EPINEPHRINE (PF) 0.5% -1:200000 IJ SOLN
INTRAMUSCULAR | Status: AC
  Filled 2021-05-14: qty 30

## 2021-05-14 MED ORDER — MIDAZOLAM HCL 2 MG/2ML IJ SOLN
INTRAMUSCULAR | Status: AC
  Filled 2021-05-14: qty 2

## 2021-05-14 MED ORDER — TRANEXAMIC ACID 1000 MG/10ML IV SOLN
INTRAVENOUS | Status: AC
  Filled 2021-05-14: qty 10

## 2021-05-14 MED ORDER — ACETAMINOPHEN 10 MG/ML IV SOLN
INTRAVENOUS | Status: DC | PRN
  Administered 2021-05-14: 1000 mg via INTRAVENOUS

## 2021-05-14 SURGICAL SUPPLY — 65 items
APL PRP STRL LF DISP 70% ISPRP (MISCELLANEOUS) ×2
BIT DRILL 2.4 (BIT) ×1 IMPLANT
BLADE SAGITTAL WIDE XTHICK NO (BLADE) ×2 IMPLANT
BLADE SURG SZ20 CARB STEEL (BLADE) ×2 IMPLANT
CHLORAPREP W/TINT 26 (MISCELLANEOUS) ×3 IMPLANT
DRAPE 3/4 80X56 (DRAPES) ×2 IMPLANT
DRAPE IMP U-DRAPE 54X76 (DRAPES) ×1 IMPLANT
DRAPE INCISE IOBAN 66X60 STRL (DRAPES) ×2 IMPLANT
DRAPE SURG 17X11 SM STRL (DRAPES) ×4 IMPLANT
DRSG MEPILEX SACRM 8.7X9.8 (GAUZE/BANDAGES/DRESSINGS) ×2 IMPLANT
DRSG OPSITE POSTOP 4X10 (GAUZE/BANDAGES/DRESSINGS) ×2 IMPLANT
ELECT CAUTERY BLADE 6.4 (BLADE) ×2 IMPLANT
GAUZE 4X4 16PLY ~~LOC~~+RFID DBL (SPONGE) ×2 IMPLANT
GAUZE XEROFORM 1X8 LF (GAUZE/BANDAGES/DRESSINGS) ×2 IMPLANT
GLOVE BIO SURGEON STRL SZ8 (GLOVE) ×1 IMPLANT
GLOVE BIOGEL PI IND STRL 8 (GLOVE) ×1 IMPLANT
GLOVE BIOGEL PI INDICATOR 8 (GLOVE) ×1
GLOVE SURG ENC MOIS LTX SZ7.5 (GLOVE) ×8 IMPLANT
GLOVE SURG ENC MOIS LTX SZ8 (GLOVE) ×8 IMPLANT
GLOVE SURG SYN 7.0 (GLOVE) ×2 IMPLANT
GLOVE SURG SYN 7.0 PF PI (GLOVE) IMPLANT
GLOVE SURG UNDER LTX SZ8 (GLOVE) ×2 IMPLANT
GOWN STRL REUS W/ TWL LRG LVL3 (GOWN DISPOSABLE) ×1 IMPLANT
GOWN STRL REUS W/ TWL XL LVL3 (GOWN DISPOSABLE) ×1 IMPLANT
GOWN STRL REUS W/TWL LRG LVL3 (GOWN DISPOSABLE) ×6
GOWN STRL REUS W/TWL XL LVL3 (GOWN DISPOSABLE) ×2
HEAD CERAMIC BIOLOX 32 TP1 -3 (Head) ×1 IMPLANT
HIP SHELL ACETAB 3H 48MM C (Hips) ×2 IMPLANT
HOLSTER ELECTROSUGICAL PENCIL (MISCELLANEOUS) ×2 IMPLANT
HOOD PEEL AWAY FLYTE STAYCOOL (MISCELLANEOUS) ×7 IMPLANT
IMMBOLIZER KNEE 19 BLUE UNIV (SOFTGOODS) ×1 IMPLANT
IV NS IRRIG 3000ML ARTHROMATIC (IV SOLUTION) ×2 IMPLANT
KIT TURNOVER KIT A (KITS) ×2 IMPLANT
LINER ACE G7 32 SZC HIGH WALL (Liner) ×1 IMPLANT
MANIFOLD NEPTUNE II (INSTRUMENTS) ×2 IMPLANT
MAT ABSORB  FLUID 56X50 GRAY (MISCELLANEOUS) ×1
MAT ABSORB FLUID 56X50 GRAY (MISCELLANEOUS) ×1 IMPLANT
NDL FILTER BLUNT 18X1 1/2 (NEEDLE) ×1 IMPLANT
NDL SAFETY ECLIPSE 18X1.5 (NEEDLE) ×2 IMPLANT
NDL SPNL 20GX3.5 QUINCKE YW (NEEDLE) ×1 IMPLANT
NEEDLE FILTER BLUNT 18X 1/2SAF (NEEDLE) ×1
NEEDLE FILTER BLUNT 18X1 1/2 (NEEDLE) ×1 IMPLANT
NEEDLE HYPO 18GX1.5 SHARP (NEEDLE) ×4
NEEDLE SPNL 20GX3.5 QUINCKE YW (NEEDLE) ×2 IMPLANT
PACK HIP PROSTHESIS (MISCELLANEOUS) ×2 IMPLANT
PENCIL SMOKE EVACUATOR (MISCELLANEOUS) ×2 IMPLANT
PIN STEINMAN 3/16 (PIN) ×2 IMPLANT
PULSAVAC PLUS IRRIG FAN TIP (DISPOSABLE) ×2
SHELL ACETAB HIP 3H 48MM C (Hips) IMPLANT
SPONGE T-LAP 18X18 ~~LOC~~+RFID (SPONGE) ×8 IMPLANT
ST PIN 3/16X9 BAY 6PK (Pin) ×1 IMPLANT
STAPLER SKIN PROX 35W (STAPLE) ×2 IMPLANT
STEM COLLARLESS FULL 9X125 (Stem) ×1 IMPLANT
SUT TICRON 2-0 30IN 311381 (SUTURE) ×6 IMPLANT
SUT VIC AB 0 CT1 36 (SUTURE) ×2 IMPLANT
SUT VIC AB 1 CT1 36 (SUTURE) ×2 IMPLANT
SUT VIC AB 2-0 CT1 (SUTURE) ×6 IMPLANT
SYR 10ML LL (SYRINGE) ×2 IMPLANT
SYR 20ML LL LF (SYRINGE) ×2 IMPLANT
SYR 30ML LL (SYRINGE) ×4 IMPLANT
TAPE TRANSPORE STRL 2 31045 (GAUZE/BANDAGES/DRESSINGS) ×2 IMPLANT
TIP FAN IRRIG PULSAVAC PLUS (DISPOSABLE) ×1 IMPLANT
TRAP FLUID SMOKE EVACUATOR (MISCELLANEOUS) ×2 IMPLANT
WATER STERILE IRR 1000ML POUR (IV SOLUTION) ×2 IMPLANT
WATER STERILE IRR 500ML POUR (IV SOLUTION) ×2 IMPLANT

## 2021-05-14 NOTE — Anesthesia Preprocedure Evaluation (Signed)
Anesthesia Evaluation  ?Patient identified by MRN, date of birth, ID band ?Patient awake ? ? ? ?Reviewed: ?Allergy & Precautions, NPO status , Patient's Chart, lab work & pertinent test results ? ?History of Anesthesia Complications ?Negative for: history of anesthetic complications ? ?Airway ?Mallampati: II ? ?TM Distance: >3 FB ?Neck ROM: full ? ? ? Dental ? ?(+) Poor Dentition, Missing ?  ?Pulmonary ?shortness of breath and with exertion, COPD, Current Smoker and Patient abstained from smoking.,  ?  ?Pulmonary exam normal ? ? ? ? ? ? ? Cardiovascular ?Exercise Tolerance: Good ?(-) angina(-) Past MI and (-) DOE negative cardio ROS ?Normal cardiovascular exam ? ? ?  ?Neuro/Psych ?PSYCHIATRIC DISORDERS negative neurological ROS ?   ? GI/Hepatic ?Neg liver ROS, GERD  Controlled,  ?Endo/Other  ?negative endocrine ROS ? Renal/GU ?  ? ?  ?Musculoskeletal ? ?(+) Arthritis ,  ? Abdominal ?  ?Peds ? Hematology ?negative hematology ROS ?(+)   ?Anesthesia Other Findings ?Past Medical History: ?No date: Arthritis ?No date: Depression ?No date: Eczema ?No date: GERD (gastroesophageal reflux disease) ?No date: Hemorrhoids ?No date: Osteoporosis ?No date: Shingles ?No date: Wears dentures ?    Comment:  full upper ? ?Past Surgical History: ?1981: ABDOMINAL HYSTERECTOMY ?07/21/2018: CATARACT EXTRACTION W/PHACO; Right ?    Comment:  Procedure: CATARACT EXTRACTION PHACO AND INTRAOCULAR  ?             LENS PLACEMENT (IOC) RIGHT;  Surgeon: Wallace Going,  ?             Nila Nephew, MD;  Location: [place];  Service:  ?             Ophthalmology;  Laterality: Right; ?08/16/2018: CATARACT EXTRACTION W/PHACO; Left ?    Comment:  Procedure: CATARACT EXTRACTION PHACO AND INTRAOCULAR  ?             LENS PLACEMENT (IOC)  LEFT;  Surgeon: Wallace Going,  ?             Nila Nephew, MD;  Location: [place];  Service:  ?             Ophthalmology;  Laterality: Left; ?No date:  CHOLECYSTECTOMY ?11/15/2014: COLONOSCOPY WITH PROPOFOL; N/A ?    Comment:  Procedure: COLONOSCOPY WITH PROPOFOL;  Surgeon: Rodman Key  ?             Elmyra Ricks, MD;  Location: [facility] ENDOSCOPY;  Service:  ?             Endoscopy;  Laterality: N/A; ?06/05/2019: DIRECT LARYNGOSCOPY; Bilateral ?    Comment:  Procedure: SUSPENSION MICRODIRECT LARYNGOSCOPY;   ?             Surgeon: Carloyn Manner, MD;  Location: [facility] ORS;   ?             Service: ENT;  Laterality: Bilateral; ?No date: Excision of Loose Body Knee; Right ?No date: JOINT REPLACEMENT ?    Comment:  left hip ?No date: KNEE SURGERY ?No date: THROAT SURGERY ?No date: TONSILLECTOMY ?03/23/2018: TOTAL HIP ARTHROPLASTY; Left ?    Comment:  Procedure: TOTAL HIP ARTHROPLASTY-LEFT;  Surgeon: Poggi, ?             Marshall Cork, MD;  Location: [facility] ORS;  Service: Orthopedics;   ?             Laterality: Left; ? ?BMI   ? Body Mass Index: 25.06 kg/m?  ?  ? ? Reproductive/Obstetrics ?negative OB ROS ? ?  ? ? ? ? ? ? ? ? ? ? ? ? ? ?  ?  ? ? ? ? ? ? ? ? ?  Anesthesia Physical ?Anesthesia Plan ? ?ASA: 3 ? ?Anesthesia Plan: Spinal  ? ?Post-op Pain Management:   ? ?Induction:  ? ?PONV Risk Score and Plan:  ? ?Airway Management Planned: Natural Airway and Nasal Cannula ? ?Additional Equipment:  ? ?Intra-op Plan:  ? ?Post-operative Plan:  ? ?Informed Consent: I have reviewed the patients History and Physical, chart, labs and discussed the procedure including the risks, benefits and alternatives for the proposed anesthesia with the patient or authorized representative who has indicated his/her understanding and acceptance.  ? ? ? ?Dental Advisory Given ? ?Plan Discussed with: Anesthesiologist, CRNA and Surgeon ? ?Anesthesia Plan Comments: (Patient reports no bleeding problems and no anticoagulant use. ? ?Plan for spinal with backup GA ? ?Patient consented for risks of anesthesia including but not limited to:  ?- adverse reactions to medications ?- damage to eyes, teeth, lips or other oral  mucosa ?- nerve damage due to positioning  ?- risk of bleeding, infection and or nerve damage from spinal that could lead to paralysis ?- risk of headache or failed spinal ?- damage to teeth, lips or other oral mucosa ?- sore throat or hoarseness ?- damage to heart, brain, nerves, lungs, other parts of body or loss of life ? ?Patient voiced understanding.)  ? ? ? ? ? ? ?Anesthesia Quick Evaluation ? ?

## 2021-05-14 NOTE — Evaluation (Signed)
Physical Therapy Evaluation ?Patient Details ?Name: Heidi Barrera ?MRN: [id number] ?DOB: 12-02-1950 ?Today's Date: 05/14/2021 ? ?History of Present Illness ? Patient is a 71 year old female s/p posterior right total hip arthroplasty  ?Clinical Impression ? Physical Therapy Evaluation completed this date. Patient reported 7-8/10 pain in R hip throughout session. States she was Mod I/Independent prior to hospitalization, and lives in a mobile home with her SO and daughter. There are 5 STE with a R handrail, and patient reports having a RW at home upon discharge. Patient demonstrated WFL limit strength in BUEs, and at least 3+/5 strength on the LLE. Due to continued spinal affects RLE strength and sensation were limited. Patient was able to demonstrate all bed mobility at [facility] with increased time and effort and no physical assistance required. Cueing on maintaining hip precautions required. Upon sitting patient demonstrated fair sitting balance, and was able to complete sit to stand from EOB with RW at [facility]. Ambulation deferred this session due to spinal remaining intact, however good static standing noted. Patient was left in bed with all needs met and in reach. Will need to perform stairs and ambulation prior to discharge. Patient would continue to benefit from skilled physical therapy in order to optimize patient's return to PLOF. Recommend HHPT upon discharge from acute hospitalization.  ?   ? ?Recommendations for follow up therapy are one component of a multi-disciplinary discharge planning process, led by the attending physician.  Recommendations may be updated based on patient status, additional functional criteria and insurance authorization. ? ?Follow Up Recommendations Home health PT ? ?  ?Assistance Recommended at Discharge Intermittent Supervision/Assistance  ?Patient can return home with the following ? A little help with walking and/or transfers;Help with stairs or ramp for entrance ? ?  ?Equipment  Recommendations None recommended by PT (patient reported having all DME equitment at home)  ?Recommendations for Other Services ?    ?  ?Functional Status Assessment Patient has had a recent decline in their functional status and demonstrates the ability to make significant improvements in function in a reasonable and predictable amount of time.  ? ?  ?Precautions / Restrictions Precautions ?Precautions: Fall;Posterior Hip ?Precaution Booklet Issued: Yes (comment) ?Restrictions ?Weight Bearing Restrictions: Yes  ? ?  ? ?Mobility ? Bed Mobility ?Overal bed mobility: Needs Assistance ?Bed Mobility: Supine to Sit, Sit to Supine ?  ?  ?Supine to sit: Supervision ?Sit to supine: Supervision ?  ?General bed mobility comments: no physical assistance required, however increased time/effort to complete, cueing on maintaining posterior hip precautions ?  ? ?Transfers ?Overall transfer level: Needs assistance ?Equipment used: Rolling walker (2 wheels) ?Transfers: Sit to/from Stand ?Sit to Stand: Min guard ?  ?  ?  ?  ?  ?General transfer comment: cueing on hand placement, good static standing ?  ? ?Ambulation/Gait ?Ambulation/Gait assistance:  (unable to complete without R leg buckling due to continued spinal affects) ?  ?  ?  ?  ?  ?  ?  ? ?Stairs ?  ?  ?  ?  ?  ? ?Wheelchair Mobility ?  ? ?Modified Rankin (Stroke Patients Only) ?  ? ?  ? ?Balance Overall balance assessment: Needs assistance ?Sitting-balance support: Bilateral upper extremity supported, Feet supported ?Sitting balance-Leahy Scale: Fair ?Sitting balance - Comments: cueing on maintaining hip precautions sitting EOB ?  ?Standing balance support: Bilateral upper extremity supported, During functional activity, Reliant on assistive device for balance ?Standing balance-Leahy Scale: Fair ?Standing balance comment: Good static standing  however unable to ambulate at this time due to continued spinal affects ?  ?  ?  ?  ?  ?  ?  ?  ?  ?  ?  ?   ? ? ? ?Pertinent  Vitals/Pain Pain Assessment ?Pain Assessment: 0-10 ?Pain Score: 8  ?Pain Location: R hip ?Pain Descriptors / Indicators: Discomfort, Constant ?Pain Intervention(s): Monitored during session, Limited activity within patient's tolerance, Repositioned  ? ? ?Home Living Family/patient expects to be discharged to:: Private residence ?Living Arrangements: Spouse/significant other;Children ?Available Help at Discharge: Family;Available 24 hours/day (Will have 24/7 assistance until May 8th) ?Type of Home: Mobile home ?Home Access: Stairs to enter ?Entrance Stairs-Rails: Right ?Entrance Stairs-Number of Steps: 5 ?  ?Home Layout: One level ?Home Equipment: Conservation officer, nature (2 wheels);Shower seat ?   ?  ?Prior Function Prior Level of Function : Independent/Modified Independent ?  ?  ?  ?  ?  ?  ?Mobility Comments: no AD at baseline ?  ?  ? ? ?Hand Dominance  ? Dominant Hand: Right ? ?  ?Extremity/Trunk Assessment  ? Upper Extremity Assessment ?Upper Extremity Assessment: Overall WFL for tasks assessed ?  ? ?Lower Extremity Assessment ?Lower Extremity Assessment: Generalized weakness;RLE deficits/detail;LLE deficits/detail ?RLE Deficits / Details: Unable to fully assess due to spinal still intact ?RLE Sensation: decreased light touch ?LLE Deficits / Details: WFL ROM, at least 3+/5 strength ?LLE Sensation: WNL ?  ? ?   ?Communication  ? Communication: No difficulties  ?Cognition Arousal/Alertness: Awake/alert ?Behavior During Therapy: [facility] for tasks assessed/performed ?Overall Cognitive Status: Within Functional Limits for tasks assessed ?  ?  ?  ?  ?  ?  ?  ?  ?  ?  ?  ?  ?  ?  ?  ?  ?General Comments: A&Ox3 self location and situation ?  ?  ? ?  ?General Comments   ? ?  ?Exercises Other Exercises ?Other Exercises: patient educated on role of PT in acute care setting, fall risk, WBing precautions, Hip precautions, and d/c recommendations  ? ?Assessment/Plan  ?  ?PT Assessment Patient needs continued PT services  ?PT Problem List  Decreased strength;Decreased mobility;Decreased range of motion;Decreased balance;Pain ? ?   ?  ?PT Treatment Interventions Therapeutic activities;Gait training;Therapeutic exercise;Balance training;Stair training   ? ?PT Goals (Current goals can be found in the Care Plan section)  ?Acute Rehab PT Goals ?Patient Stated Goal: to go home ?PT Goal Formulation: With patient ?Time For Goal Achievement: 05/28/21 ?Potential to Achieve Goals: Fair ? ?  ?Frequency BID ?  ? ? ?Co-evaluation   ?  ?  ?  ?  ? ? ?  ?AM-PAC PT "6 Clicks" Mobility  ?Outcome Measure Help needed turning from your back to your side while in a flat bed without using bedrails?: None ?Help needed moving from lying on your back to sitting on the side of a flat bed without using bedrails?: None ?Help needed moving to and from a bed to a chair (including a wheelchair)?: A Little ?Help needed standing up from a chair using your arms (e.g., wheelchair or bedside chair)?: A Little ?Help needed to walk in hospital room?: A Little ?Help needed climbing 3-5 steps with a railing? : A Little ?6 Click Score: 20 ? ?  ?End of Session Equipment Utilized During Treatment: Gait belt ?Activity Tolerance: Patient tolerated treatment well ?Patient left: in bed;with call bell/phone within reach;with family/visitor present ?Nurse Communication: Mobility status ?  ?  ? ?Time: 0160-1093 ?PT  Time Calculation (min) (ACUTE ONLY): 18 min ? ? ?Charges:   PT Evaluation ?$PT Eval Low Complexity: 1 Low ?  ?  ?   ? ?Iva Boop, PT  ?05/14/21. 1:57 PM ? ? ?

## 2021-05-14 NOTE — Anesthesia Procedure Notes (Signed)
Procedure Name: General with mask airway ?Date/Time: 05/14/2021 8:10 AM ?Performed by: Kelton Pillar, CRNA ?Pre-anesthesia Checklist: Patient identified, Emergency Drugs available, Suction available and Patient being monitored ?Patient Re-evaluated:Patient Re-evaluated prior to induction ?Oxygen Delivery Method: Simple face mask ?Induction Type: IV induction ?Placement Confirmation: CO2 detector, breath sounds checked- equal and bilateral and positive ETCO2 ?Dental Injury: Teeth and Oropharynx as per pre-operative assessment  ? ? ? ? ?

## 2021-05-14 NOTE — Discharge Instructions (Signed)
Orthopedic discharge instructions: ?May shower with intact OpSite dressing. ?Apply ice frequently to hip. ?Start Eliquis 2.5 mg twice daily for 2 weeks as of Friday, 05/15/2021, then take aspirin 325 mg twice daily for 4 weeks. ?Take pain medication as prescribed or ES Tylenol when needed.  ?May weight-bear as tolerated - use walker for balance and support. ?Observe posterior hip precautions at all times. ?Follow-up in 10-14 days or as scheduled. ?

## 2021-05-14 NOTE — Progress Notes (Signed)
Physical Therapy Treatment ?Patient Details ?Name: Heidi Barrera ?MRN: [id number] ?DOB: 1950-06-27 ?Today's Date: 05/14/2021 ? ? ?History of Present Illness Patient is a 71 year old female s/p posterior right total hip arthroplasty ? ?  ?PT Comments  ? ? Pt was long sitting in bed with supportive significant other at bedside. She agrees to session and was cooperative throughout. Pt is eager to DC home. Per MD/RN pt is to use R knee immobilizer to prevent R knee buckling. Pt was easily and safely able to exit bed, stand to RW, and ambulate. She was advised to continue to wear knee immobilizer at all times when ambulating. She safely performed ascending/descending 4 stair with BUE support on rails. She has stairs with Bilateral railing at home. Pt is cleared from an acute PT standpoint for safe DC home. Pt has 24/7 assistance at DC and already set up for HHPT. She will continue to benefit from skilled PT to address deficits while maximizing safety with all ADLs.  ?  ?Recommendations for follow up therapy are one component of a multi-disciplinary discharge planning process, led by the attending physician.  Recommendations may be updated based on patient status, additional functional criteria and insurance authorization. ? ?Follow Up Recommendations ? Home health PT ?  ?  ?Assistance Recommended at Discharge Intermittent Supervision/Assistance  ?Patient can return home with the following A little help with walking and/or transfers;Help with stairs or ramp for entrance ?  ?Equipment Recommendations ? None recommended by PT  ?  ?   ?Precautions / Restrictions Precautions ?Precautions: Fall;Posterior Hip ?Precaution Booklet Issued: Yes (comment) ?Required Braces or Orthoses: Knee Immobilizer - Right (Per RN/MD try to use R knee immobilizer to prevent/assist R knee from buckling) ?Knee Immobilizer - Right: On when out of bed or walking ?Restrictions ?Weight Bearing Restrictions: Yes ?RLE Weight Bearing: Weight bearing as  tolerated  ?  ? ?Mobility ? Bed Mobility ?Overal bed mobility: Needs Assistance ?Bed Mobility: Supine to Sit, Sit to Supine ?  ?  ?Supine to sit: Supervision ?Sit to supine: Supervision ?  ?General bed mobility comments: no physical assistance required to exit or return to bed. supportive significant other present and will be with pt 24/7 at DC ?  ? ?Transfers ?Overall transfer level: Needs assistance ?Equipment used: Rolling walker (2 wheels) ?Transfers: Sit to/from Stand ?Sit to Stand: Min guard, Supervision ?  ?   ?General transfer comment: CGA for safety progressing to supervision. pt still has slight knee buckling but with use of knee immobilizer was able to correct without author intervention. ?  ? ?Ambulation/Gait ?Ambulation/Gait assistance: Min guard ?Gait Distance (Feet): 100 Feet ?Assistive device: Rolling walker (2 wheels) ?Gait Pattern/deviations: Step-through pattern ?Gait velocity: decreased ?  ?  ?General Gait Details: Pt was able to ambulate ~ 100 ft total with RW. no LOB but did require some cue for locking R knee in extension prior to advancement of LLE. ? ? ?Stairs ?Stairs: Yes ?Stairs assistance: Min guard ?Stair Management: Two rails, Step to pattern, Forwards ?Number of Stairs: 4 ?General stair comments: pt demonstrated safe ability to ascend/descend 4 stair without LOB or safety concern. supportive caregiver present and will be assisting her at home. ? ?  ?Balance Overall balance assessment: Modified Independent ?Sitting-balance support: Feet supported, No upper extremity supported ?Sitting balance-Leahy Scale: Normal ?  ?  ?Standing balance support: Bilateral upper extremity supported, During functional activity, Reliant on assistive device for balance ?Standing balance-Leahy Scale: Fair ?Standing balance comment: reliant on RW for  support/safety ?  ?  ?  ?Cognition Arousal/Alertness: Awake/alert ?Behavior During Therapy: [facility] for tasks assessed/performed ?Overall Cognitive Status: Within  Functional Limits for tasks assessed ?  ?   ?General Comments: Pt is A and O x 4. she is eager to DC home today. ?  ?  ? ?  ?   ?General Comments General comments (skin integrity, edema, etc.): pt has already performed HEP and was educated on posterior precautions ?  ?  ? ?Pertinent Vitals/Pain Pain Assessment ?Pain Assessment: No/denies pain ?Pain Score: 0-No pain ?Pain Location: R hip ?Pain Intervention(s): Limited activity within patient's tolerance, Monitored during session, Repositioned  ? ? ?Home Living Family/patient expects to be discharged to:: Private residence ?Living Arrangements: Spouse/significant other;Children ?Available Help at Discharge: Family;Available 24 hours/day (Will have 24/7 assistance until May 8th) ?Type of Home: Mobile home ?Home Access: Stairs to enter ?Entrance Stairs-Rails: Right ?Entrance Stairs-Number of Steps: 5 ?  ?Home Layout: One level ?Home Equipment: Conservation officer, nature (2 wheels);Shower seat ?   ?  ?   ? ?PT Goals (current goals can now be found in the care plan section) Acute Rehab PT Goals ?Patient Stated Goal: to go home ?PT Goal Formulation: With patient ?Time For Goal Achievement: 05/28/21 ?Potential to Achieve Goals: Fair ?Progress towards PT goals: Progressing toward goals ? ?  ?Frequency ? ? ? BID ? ? ? ?  ?PT Plan Current plan remains appropriate  ? ? ?   ?AM-PAC PT "6 Clicks" Mobility   ?Outcome Measure ? Help needed turning from your back to your side while in a flat bed without using bedrails?: None ?Help needed moving from lying on your back to sitting on the side of a flat bed without using bedrails?: None ?Help needed moving to and from a bed to a chair (including a wheelchair)?: A Little ?Help needed standing up from a chair using your arms (e.g., wheelchair or bedside chair)?: A Little ?Help needed to walk in hospital room?: A Little ?Help needed climbing 3-5 steps with a railing? : A Little ?6 Click Score: 20 ? ?  ?End of Session Equipment Utilized During  Treatment: Gait belt ?Activity Tolerance: Patient tolerated treatment well ?Patient left: in bed;with call bell/phone within reach;with family/visitor present ?Nurse Communication: Mobility status ?PT Visit Diagnosis: Unsteadiness on feet (R26.81);Muscle weakness (generalized) (M62.81);Pain ?Pain - Right/Left: Right ?Pain - part of body: Hip ?  ? ? ?Time: 0881-1031 ?PT Time Calculation (min) (ACUTE ONLY): 19 min ? ?Charges:  $Gait Training: 8-22 mins ?$Therapeutic Exercise: 8-22 mins          ?          ? ?Julaine Fusi PTA ?05/14/21, 5:09 PM  ? ?

## 2021-05-14 NOTE — Progress Notes (Cosign Needed)
Patient is not able to walk the distance required to go the bathroom, or he/she is unable to safely negotiate stairs required to access the bathroom.  A 3in1 BSC will alleviate this problem  

## 2021-05-14 NOTE — Progress Notes (Signed)
Physical Therapy Treatment ?Patient Details ?Name: Heidi Barrera ?MRN: [id number] ?DOB: 16-Nov-1950 ?Today's Date: 05/14/2021 ? ? ?History of Present Illness Patient is a 71 year old female s/p posterior right total hip arthroplasty ? ?  ?PT Comments  ? ? Patient tolerated session well despite frustrated from lingering spinal anesthesia affects. Minimal pain reported throughout session 2/10. Patient completed x2 supine<>sitting at [facility] with no physical assistance required and increased time to complete. Patient demonstrated good recall on hip precautions and proper hand placement for sit to stands. Sit to stand from EOB completed at [facility]. Patient continues to demonstrate fair static standing balance with RW at [facility], however when attempting to take a step, RLE buckled due to spinal remaining in-tact. Patient required Max A to assist back to EOB. HEP packet was reviewed with patient, and patient demonstrated and verbalized understanding of each exercise. Patient was left in bed with all needs met and in reach. Patient will need to ambulate and ascend/descend stairs prior to discharge. Continue to recommend HHPT upon discharge from acute hospitalization to optimize patient's return to PLOF. ?   ?Recommendations for follow up therapy are one component of a multi-disciplinary discharge planning process, led by the attending physician.  Recommendations may be updated based on patient status, additional functional criteria and insurance authorization. ? ?Follow Up Recommendations ? Home health PT ?  ?  ?Assistance Recommended at Discharge Intermittent Supervision/Assistance  ?Patient can return home with the following A little help with walking and/or transfers;Help with stairs or ramp for entrance ?  ?Equipment Recommendations ? None recommended by PT (patient has all DME required at home)  ?  ?Recommendations for Other Services   ? ? ?  ?Precautions / Restrictions Precautions ?Precautions: Fall;Posterior Hip ?Precaution  Booklet Issued: Yes (comment) ?Restrictions ?Weight Bearing Restrictions: Yes ?RLE Weight Bearing: Weight bearing as tolerated  ?  ? ?Mobility ? Bed Mobility ?Overal bed mobility: Needs Assistance ?Bed Mobility: Supine to Sit, Sit to Supine ?  ?  ?Supine to sit: Supervision ?Sit to supine: Supervision ?  ?General bed mobility comments: no physical assistance required, however increased time/effort to complete, patient completed supine<>sitting twice ?  ? ?Transfers ?Overall transfer level: Needs assistance ?Equipment used: Rolling walker (2 wheels) ?Transfers: Sit to/from Stand ?Sit to Stand: Min guard ?  ?  ?  ?  ?  ?General transfer comment: good recall on hand placement ?  ? ?Ambulation/Gait ?Ambulation/Gait assistance:  (unable to complete as spinal still intact) ?  ?  ?  ?  ?  ?  ?General Gait Details: patient attempted to take 1 step with RW at [facility] however RLE buckled and required Max A back to EOB ? ? ?Stairs ?  ?  ?  ?  ?  ? ? ?Wheelchair Mobility ?  ? ?Modified Rankin (Stroke Patients Only) ?  ? ? ?  ?Balance Overall balance assessment: Needs assistance ?Sitting-balance support: Bilateral upper extremity supported, Feet supported ?Sitting balance-Leahy Scale: Fair ?Sitting balance - Comments: cueing on maintaining hip precautions sitting EOB ?  ?Standing balance support: Bilateral upper extremity supported, During functional activity, Reliant on assistive device for balance ?Standing balance-Leahy Scale: Fair ?Standing balance comment: fair static standing, poor dynamic standing balance ?  ?  ?  ?  ?  ?  ?  ?  ?  ?  ?  ?  ? ?  ?Cognition Arousal/Alertness: Awake/alert ?Behavior During Therapy: [facility] for tasks assessed/performed ?Overall Cognitive Status: Within Functional Limits for tasks assessed ?  ?  ?  ?  ?  ?  ?  ?  ?  ?  ?  ?  ?  ?  ?  ?  ?  General Comments: A&Ox3 self location and situation ?  ?  ? ?  ?Exercises Total Joint Exercises ?Ankle Circles/Pumps: Supine, AROM, 20 reps, Both ?Quad Sets: Supine,  AROM, 10 reps, Both ?Heel Slides: Supine, AROM, Left, AAROM, Right, Both ?Hip ABduction/ADduction: Supine, AROM, 10 reps, Both ?Long Arc Quad: Seated, Left, AROM, Right, AAROM, 10 reps, Both ?Other Exercises ?Other Exercises: patient educated on role of PT in acute care setting, fall risk, WBing precautions, Hip precautions, and d/c recommendations ? ?  ?General Comments   ?  ?  ? ?Pertinent Vitals/Pain Pain Assessment ?Pain Assessment: 0-10 ?Pain Score: 2  ?Pain Location: R hip ?Pain Descriptors / Indicators: Discomfort, Constant ?Pain Intervention(s): Monitored during session  ? ? ?Home Living Family/patient expects to be discharged to:: Private residence ?Living Arrangements: Spouse/significant other;Children ?Available Help at Discharge: Family;Available 24 hours/day (Will have 24/7 assistance until May 8th) ?Type of Home: Mobile home ?Home Access: Stairs to enter ?Entrance Stairs-Rails: Right ?Entrance Stairs-Number of Steps: 5 ?  ?Home Layout: One level ?Home Equipment: Conservation officer, nature (2 wheels);Shower seat ?   ?  ?Prior Function    ?  ?  ?   ? ?PT Goals (current goals can now be found in the care plan section) Acute Rehab PT Goals ?Patient Stated Goal: to go home ?PT Goal Formulation: With patient ?Time For Goal Achievement: 05/28/21 ?Potential to Achieve Goals: Fair ?Progress towards PT goals: Progressing toward goals ? ?  ?Frequency ? ? ? BID ? ? ? ?  ?PT Plan Current plan remains appropriate  ? ? ?Co-evaluation   ?  ?  ?  ?  ? ?  ?AM-PAC PT "6 Clicks" Mobility   ?Outcome Measure ? Help needed turning from your back to your side while in a flat bed without using bedrails?: None ?Help needed moving from lying on your back to sitting on the side of a flat bed without using bedrails?: None ?Help needed moving to and from a bed to a chair (including a wheelchair)?: A Little ?Help needed standing up from a chair using your arms (e.g., wheelchair or bedside chair)?: A Little ?Help needed to walk in hospital  room?: A Lot ?Help needed climbing 3-5 steps with a railing? : A Lot ?6 Click Score: 18 ? ?  ?End of Session Equipment Utilized During Treatment: Gait belt ?Activity Tolerance: Patient tolerated treatment well ?Patient left: in bed;with call bell/phone within reach;with family/visitor present ?Nurse Communication: Mobility status ?PT Visit Diagnosis: Unsteadiness on feet (R26.81);Muscle weakness (generalized) (M62.81);Pain ?Pain - Right/Left: Right ?Pain - part of body: Hip ?  ? ? ?Time: 7366-8159 ?PT Time Calculation (min) (ACUTE ONLY): 19 min ? ?Charges:  $Therapeutic Exercise: 8-22 mins          ?          ? ?Iva Boop, PT  ?05/14/21. 3:15 PM ? ? ?

## 2021-05-14 NOTE — Op Note (Signed)
05/14/2021 ? ?10:05 AM ? ?Patient:   Heidi Barrera ? ?Pre-Op Diagnosis:   Degenerative joint disease, right hip. ? ?Post-Op Diagnosis:   Same. ? ?Procedure:   Right total hip arthroplasty. ? ?Surgeon:   Pascal Lux, MD ? ?Assistant:   Cameron Proud, PA-C; Myra Rude, PA-S ? ?Anesthesia:   Spinal ? ?Findings:   As above. ? ?Complications:   None ? ?EBL:   75 cc ? ?Fluids:   700 cc crystalloid ? ?UOP:   None ? ?TT:   None ? ?Drains:   None ? ?Closure:   Staples ? ?Implants:   Biomet press-fit system with a #9 laterally offset Echo femoral stem, a 48 mm acetabular shell with an E-poly hi-wall liner, and a 32 mm ceramic head with a -3 mm neck. ? ?Brief Clinical Note:   The patient is a 71 year old female with a history of progressively worsening right hip pain. Her symptoms have progressed despite medications, activity modification, etc. Her history and examination are consistent with degenerative joint disease of the right hip, confirmed by plain radiographs. The patient presents at this time for a right total hip arthroplasty.  ? ?Procedure:   The patient was brought into the operating room. After adequate spinal anesthesia was obtained, the patient was repositioned in the left lateral decubitus position and secured using a lateral hip positioner. The right hip and lower extremity were prepped with ChloroPrep solution before being draped sterilely. Preoperative antibiotics were administered. A timeout was performed to verify the appropriate surgical site.   ? ?A standard posterior approach to the hip was made through an approximately 4-5 inch incision. The incision was carried down through the subcutaneous tissues to expose the gluteal fascia and proximal end of the iliotibial band. These structures were split the length of the incision and the Charnley self-retaining hip retractor placed. The bursal tissues were swept posteriorly to expose the short external rotators. The anterior border of the piriformis  tendon was identified and this plane developed down through the capsule to enter the joint. A flap of tissue was elevated off the posterior aspect of the femoral neck and greater trochanter and retracted posteriorly. This flap included the piriformis tendon, the short external rotators, and the posterior capsule. The soft tissues were elevated off the lateral aspect of the ilium and a large Steinmann pin placed bicortically.  ? ?With the right leg aligned over the left, a drill bit was placed into the greater trochanter parallel to the Steinmann pin and the distance between these two pins measured in order to optimize leg lengths postoperatively. The drill bit was removed and the hip dislocated. The piriformis fossa was debrided of soft tissues before the intramedullary canal was accessed through this point using a triple step reamer. The canal was reamed sequentially beginning with a #7 tapered reamer and progressing to a #10 tapered reamer. This provided excellent circumferential chatter. Using the appropriate guide, a femoral neck cut was made one fingerbreadth above the lesser trochanter. The femoral head was removed. ? ?Attention was directed to the acetabular side. The labrum was debrided circumferentially before the ligamentum teres was removed using a large curette. A line was drawn on the drapes corresponding to the native version of the acetabulum. This line was used as a guide while the acetabulum was reamed sequentially beginning with a 42 mm reamer and progressing to a 47 mm reamer. This provided excellent circumferential chatter. The 47 mm trial acetabulum was positioned and found to fit quite  well. Therefore, the 48 mm acetabular shell was selected and impacted into place with care taken to maintain the appropriate version. The trial high wall liner was inserted. ? ?Attention was redirected to the femoral side. A box osteotome was used to establish version before the canal was broached sequentially  beginning with a #7 broach and progressing to a #10 broach. This was left in place and several trial reductions performed using both a standard and laterally offset neck options, as well as the -6 mm neck length. The hip appears to be tight even with the -6 mm neck length. Therefore, the #10 broach was removed and replaced with a #9 broach and impacted appropriately. Using the calcar planer, an additional 3 to 4 mm of femoral neck was removed. A repeat trial reduction was performed using the #9 broach with the standard and lateral offset neck options as well as the -6 mm and -3 mm neck lengths. With the -3 mm neck length and the lateral offset neck option, the hip demonstrated excellent stability both in flexion and internal rotation beyond 70 degrees, as well as with extension and external rotation.   ? ?After removing the trial components, the "manhole cover" was placed into the apex of the acetabular shell and tightened securely. The permanent E-polyethylene hi-wall liner was impacted into the acetabular shell and its locking mechanism verified using a quarter-inch osteotome. Next, the #9 laterally offset femoral stem was impacted into place with care taken to maintain the appropriate version. A repeat trial reduction was performed using the -3 mm neck lengths. The -3 mm neck length demonstrated excellent stability both in extension and external rotation as well as with flexion to 90? and internal rotation beyond 70?Marland Kitchen It also was stable in the position of sleep. In addition, leg lengths appeared to be restored appropriately, both by reassessing the position of the right leg over the left, as well as by measuring the distance between the Steinmann pin and the drill bit. The 32 mm ceramic head with the -3 mm neck was impacted onto the stem of the femoral component. The Morse taper locking mechanism was verified using manual distraction before the head was relocated and placed through a range of motion with the  findings as described above. ? ?The wound was copiously irrigated with sterile saline solution via the jet lavage system before the peri-incisional and pericapsular tissues were injected with 30 cc of 0.5% Sensorcaine with epinephrine and 20 cc of Exparel diluted out to 60 cc with normal saline to help with postoperative analgesia. The posterior flap was reapproximated to the posterior aspect of the greater trochanter using #2 Tycron interrupted sutures placed through drill holes. Several additional #2 Tycron interrupted sutures were used to reinforce this layer of closure. The iliotibial band was reapproximated using #1 Vicryl interrupted sutures before the gluteal fascia was closed using a running #1 Vicryl suture. At this point, 1 g of transexemic acid in 10 cc of normal saline was injected into the joint to help reduce postoperative bleeding. The subcutaneous tissues were closed in several layers using 2-0 Vicryl interrupted sutures before the skin was closed using staples. A sterile occlusive dressing was applied to the wound. The patient was then rolled back into the supine position on his/her hospital bed before being awakened and returned to the recovery room in satisfactory condition after tolerating the procedure well. ?

## 2021-05-14 NOTE — TOC Initial Note (Addendum)
Transition of Care (TOC) - Initial/Assessment Note  ? ? ?Patient Details  ?Name: Heidi Barrera ?MRN: [id number] ?Date of Birth: 1950-05-13 ? ?Transition of Care (TOC) CM/SW Contact:    ?Conception Oms, RN ?Phone Number: ?05/14/2021, 10:34 AM ? ?Clinical Narrative:                 ? ? ? Patient has a Rolling walker at home, will need a 3 in 1, [employer] to deliver to the bedside ? Patient is set up with [place] [facility] services prior to surgery by Surgeons office ? ? ?Patient Goals and CMS Choice ?  ?  ?  ? ?Expected Discharge Plan and Services ?  ?  ?  ?  ?  ?Expected Discharge Date: 05/14/21               ?  ?  ?  ?  ?  ?  ?  ?  ?  ?  ? ?Prior Living Arrangements/Services ?  ?  ?  ?       ?  ?  ?  ?  ? ?Activities of Daily Living ?Home Assistive Devices/Equipment: None ?ADL Screening (condition at time of admission) ?Patient's cognitive ability adequate to safely complete daily activities?: Yes ?Is the patient deaf or have difficulty hearing?: No ?Does the patient have difficulty seeing, even when wearing glasses/contacts?: No ?Does the patient have difficulty concentrating, remembering, or making decisions?: No ?Patient able to express need for assistance with ADLs?: No ?Does the patient have difficulty dressing or bathing?: No ?Independently performs ADLs?: Yes (appropriate for developmental age) ?Does the patient have difficulty walking or climbing stairs?: Yes ?Weakness of Legs: Right ?Weakness of Arms/Hands: None ? ?Permission Sought/Granted ?  ?  ?   ?   ?   ?   ? ?Emotional Assessment ?  ?  ?  ?  ?  ?  ? ?Admission diagnosis:  Primary osteoarthritis of right hip M16.11 ?Patient Active Problem List  ? Diagnosis Date Noted  ? Status post total hip replacement, left 03/23/2018  ? Personal history of tobacco use, presenting hazards to health 04/23/2015  ? ?PCP:  Adin Hector, MD ?Pharmacy:   ?[facility] [place], [place] AT [facility] OF [address] ?[address] ?[place] [address] ?Phone: [phone] Fax: [phone] ? ? ? ? ?Social Determinants of Health (SDOH) Interventions ?  ? ?Readmission Risk Interventions ?   ? View : No data to display.  ?  ?  ?  ? ? ? ?

## 2021-05-14 NOTE — Transfer of Care (Signed)
Immediate Anesthesia Transfer of Care Note ? ?Patient: Heidi Barrera ? ?Procedure(s) Performed: TOTAL HIP ARTHROPLASTY (Right: Hip) ? ?Patient Location: PACU ? ?Anesthesia Type:General ? ?Level of Consciousness: awake, drowsy and patient cooperative ? ?Airway & Oxygen Therapy: Patient Spontanous Breathing and Patient connected to face mask oxygen ? ?Post-op Assessment: Report given to RN and Post -op Vital signs reviewed and stable ? ?Post vital signs: Reviewed and stable ? ?Last Vitals:  ?Vitals Value Taken Time  ?BP 157/74 05/14/21 0958  ?Temp    ?Pulse    ?Resp 20 05/14/21 1000  ?SpO2    ?Vitals shown include unvalidated device data. ? ?Last Pain:  ?Vitals:  ? 05/14/21 0627  ?TempSrc: Oral  ?PainSc: 0-No pain  ?   ? ?Patients Stated Pain Goal: 0 (05/14/21 7681) ? ?Complications: No notable events documented. ?

## 2021-05-14 NOTE — H&P (Signed)
History of Present Illness: ?Heidi Barrera is a 71 y.o. female who presents for evaluation and treatment of her right-sided lower back, right buttock and right hip pain without radiation to either lower extremity. The symptoms have been present for several years and developed without any specific cause or injury. The pain is moderate-severe, worsening and 9 on a scale of 1-10. She describes the pain as aching, dull, stabbing and throbbing. The symptoms are aggravated by standing, walking, daily activities and at night. The symptoms improve with sitting, lying down and rest. She has been taking Tylenol and an occasional tramadol with limited benefit. She denies any numbness, weakness, paresthesias to either lower extremity. She reports no bowel or bladder complaints. She notes that her symptoms worsen through the course of the day and are quite severe by night fall, and frequently interfering with her ability to sleep well at night. She has difficulty reciprocating stairs. She is status post a left total hip arthroplasty in the past. ? ?Current Outpatient Medications: ? acetaminophen (TYLENOL) 500 MG tablet Take by mouth Take 1,000 mg by mouth every 6 (six) hours as needed for moderate pain  ? aspirin 81 MG EC tablet Take 81 mg by mouth once daily.  ? calcium carbonate (CALCIUM 500 ORAL) Take 500 mg by mouth once daily  ? cholecalciferol (VITAMIN D3) 1,000 unit capsule Take 1,000 Units by mouth once daily  ? clonazePAM (KLONOPIN) 0.5 MG tablet 1/2 to 1 tab PO at bedtime PRN sleep 90 tablet 1  ? colestipoL (COLESTID) 1 gram tablet Take 1 tablet (1 g total) by mouth 2 (two) times daily Take before meals 60 tablet 11  ? ibuprofen (MOTRIN) 200 MG tablet Take 800 mg by mouth as needed for Pain  ? mirtazapine (REMERON) 7.5 MG tablet TAKE 1 TABLET(7.5 MG) BY MOUTH EVERY NIGHT 90 tablet 3  ? pantoprazole (PROTONIX) 20 MG DR tablet TAKE 1 TABLET BY MOUTH 20 MINUTES BEFORE BREAKFAST ON AN EMPTY STOMACH 90 tablet 3  ?  rosuvastatin (CRESTOR) 5 MG tablet Take 1 tablet (5 mg total) by mouth once daily 30 tablet 11  ? sertraline (ZOLOFT) 100 MG tablet Take 1 tablet (100 mg total) by mouth once daily 90 tablet 1  ? tiZANidine (ZANAFLEX) 2 MG tablet Take 1 tablet (2 mg total) by mouth at bedtime as needed 30 tablet 1  ? traMADoL (ULTRAM) 50 mg tablet Take 1 tablet (50 mg total) by mouth every 6 (six) hours as needed for Pain for up to 30 doses 30 tablet 0  ? ?Allergies:  ? Alendronate Other (Bone pain)  ? Denosumab Other (Bone pain)  ? ?Past Medical History:  ? Age-related osteoporosis without current pathological fracture 03/24/2016 (By DEXA scan 3/18)  ? Depression  ? Diverticulosis 11/15/2014  ? Eczema, unspecified  ? External hemorrhoids 11/15/2014  ? Hemangioma of liver 05/28/2015 (Noted on CT scan incidentally. MRI/17 with 6 x 5 cm right hepatic dome hemangioma. Several smaller hemangiomas and cysts are also present)  ? Hemorrhoids  ? Internal hemorrhoids 11/15/2014  ? Liver mass 05/08/2015 (Incidentally noted on chest CT 4/17)  ? Lumbar degenerative disc disease 07/26/2016 (LS spine film 7/18 with moderate degenerative disc disease)  ? Lung nodule seen on imaging study 10/31/2015  ?Scattered pulmonary nodules less than 6 mm, noted on CT 4/17. Stable on CT 10/17. Recommendation for repeat in 1 year  ? Malignant neoplasm of skin 01/31/2014  ? Osteoarthritis  ? Polyp of esophagus  ? Ruptured disk (History of  ruptured disk).  ? Shingles  ? Trigger finger (right thumb)  ? Tubular adenoma of colon 11/15/2014  ? Ulcer  ? ?Past Surgical History:  ? KNEE ARTHROSCOPY Right 07/27/2013 (loose body excision)  ? COLONOSCOPY 11/15/2014 (Tubular adenoma of colon/Repeat 69yr/MGR)  ? trigger finger release Right thumb 11/28/2015  ? COLONOSCOPY 02/27/2018 (Negative colon biopsy/Repeat 143yrTKT)  ? EGD 02/27/2018 (GERD/No Repeat/TKT)  ? Left total hip arthroplasty. Left 03/23/2018 (Dr. PoRoland Rack ? CATARACT EXTRACTION Right 06/2018  ? CATARACT EXTRACTION  Left 07/2018  ? cataract surgery Bilateral 07/2018  ? CHOLECYSTECTOMY  ? HYSTERECTOMY  ? knee surgery Right x2 ? throat surgery  ? TONSILLECTOMY  ? ?Family History:  ? Diabetes type II Mother  ? Stroke Mother  ? No Known Problems Father  ? ?Social History:  ? ?Socioeconomic History:  ? Marital status: Widowed  ?Tobacco Use  ? Smoking status: Every Day  ?Packs/day: 0.50  ?Years: 50.00  ?Pack years: 25.00  ?Types: Cigarettes  ? Smokeless tobacco: Never  ?Vaping Use  ? Vaping Use: Never used  ?Substance and Sexual Activity  ? Alcohol use: No  ? Drug use: Never  ? Sexual activity: Defer  ? ?Review of Systems:  ?A comprehensive 14 point ROS was performed, reviewed, and the pertinent orthopaedic findings are documented in the HPI. ? ?Physical Exam: ?Vitals:  ?05/01/21 0856  ?BP: 138/68  ?Weight: 51 kg (112 lb 6.4 oz)  ?Height: 142.2 cm (4' 7.98")  ?PainSc: 9  ?PainLoc: Hip  ? ?General/Constitutional: The patient appears to be well-nourished, well-developed, and in no acute distress. ?Neuro/Psych: Normal mood and affect, oriented to person, place and time. ?Eyes: Non-icteric. Pupils are equal, round, and reactive to light, and exhibit synchronous movement. ?ENT: Unremarkable. ?Lymphatic: No palpable adenopathy. ?Respiratory: Lungs clear to auscultation, Normal chest excursion, No wheezes and Non-labored breathing. Decreased breath sounds diffusely. ?Cardiovascular: Regular rate and rhythm. No murmurs. and No edema, swelling or tenderness, except as noted in detailed exam. ?Integumentary: No impressive skin lesions present, except as noted in detailed exam. ?Musculoskeletal: Unremarkable, except as noted in detailed exam. ? ?Lumbar exam: ?Skin inspection of the lower back is unremarkable. In stance, her spine is straight and her pelvis is level. She can arise from a seated position without difficulty, and demonstrates a minimal limp, favoring her right leg, but is not using any assistive devices. She has no tenderness along  the thoracic or lumbar spine, nor across the sacrum. She has minimal pain over the lateral aspect of the right hip, but has no tenderness over either sciatic notch region or either SI joint. She can heel raise and toe raise without difficulty or weakness.  ? ?Hip exam: ?She has mild pain with right hip internal rotation and external rotation. She exhibits flexion to 100 degrees, internal rotation to 15 degrees, and external rotation to 35 degrees on the right. She is neurovascularly intact to both lower extremities. She has negative sitting straight leg raises bilaterally. ? ?X-rays/MRI/Lab data:  ?Recent x-rays of the lumbar spine and pelvis are available for review and have been reviewed by myself. The lumbar films demonstrate moderate to severe degenerative changes involving the mid and lower portions of the lumbar spine. There appears to be a grade 1 degenerative anterolisthesis of L4 on L5, as well as significantly decreased disc space at L5-S1. No fractures or lytic lesions are identified. ? ?The AP pelvis film demonstrates moderate to severe degenerative changes with near complete loss of the superior clear space and femoral  neck osteophytes. No lytic lesions or fractures are identified. ? ?Assessment: ? Primary osteoarthritis of right hip.  ? ?Plan: ?The treatment options were discussed with the patient. In addition, patient educational materials were provided regarding the diagnosis and treatment options. The patient is quite frustrated by her symptoms and functional limitations, especially as they pertain to her groin pain and worsening pain with prolonged standing or ambulation. I feel that the symptoms are primarily a result of her degenerative joint disease of the right hip. Therefore, I have recommended a surgical procedure, specifically a right total hip arthroplasty. The procedure was discussed with the patient, as were the potential risks (including bleeding, infection, nerve and/or blood vessel  injury, persistent or recurrent pain, loosening and/or failure of the components, dislocation, leg length inequality, need for further surgery, blood clots, strokes, heart attacks and/or arhythmias, pneumonia, etc.

## 2021-05-14 NOTE — Anesthesia Procedure Notes (Addendum)
Spinal ? ?Patient location during procedure: OR ?Start time: 05/14/2021 7:35 AM ?End time: 05/14/2021 7:39 AM ?Reason for block: surgical anesthesia ?Staffing ?Performed: resident/CRNA  ?Anesthesiologist: Piscitello, Precious Haws, MD ?Resident/CRNA: Doreen Salvage, CRNA ?Preanesthetic Checklist ?Completed: patient identified, IV checked, site marked, risks and benefits discussed, surgical consent, monitors and equipment checked, pre-op evaluation and timeout performed ?Spinal Block ?Patient position: sitting ?Prep: ChloraPrep and DuraPrep ?Patient monitoring: heart rate, cardiac monitor, continuous pulse ox and blood pressure ?Approach: midline ?Location: L3-4 ?Injection technique: single-shot ?Needle ?Needle type: Sprotte  ?Needle gauge: 24 G ?Needle length: 9 cm ?Assessment ?Sensory level: T4 ?Events: CSF return ?Additional Notes ?X 2 attempts. Sterile aseptic technique used throughout the procedure.  Negative paresthesia. Negative blood return. Positive free-flowing CSF. Expiration date of kit checked and confirmed. Patient tolerated procedure well, without complications. ? ? ? ? ? ?

## 2021-05-15 NOTE — Anesthesia Postprocedure Evaluation (Signed)
Anesthesia Post Note ? ?Patient: Heidi Barrera ? ?Procedure(s) Performed: TOTAL HIP ARTHROPLASTY (Right: Hip) ? ?Patient location during evaluation: Nursing Unit ?Anesthesia Type: Spinal ?Level of consciousness: oriented and awake and alert ?Pain management: pain level controlled ?Vital Signs Assessment: post-procedure vital signs reviewed and stable ?Respiratory status: spontaneous breathing ?Cardiovascular status: blood pressure returned to baseline and stable ?Postop Assessment: no headache, no backache, no apparent nausea or vomiting and patient able to bend at knees ?Anesthetic complications: no ? ? ?No notable events documented. ? ? ?Last Vitals:  ?Vitals:  ? 05/14/21 1256 05/14/21 1753  ?BP: (!) 108/57 124/65  ?Pulse: 62 65  ?Resp: 18 18  ?Temp: (!) 36.2 ?C 36.7 ?C  ?SpO2: 99% 98%  ?  ?Last Pain:  ?Vitals:  ? 05/15/21 0826  ?TempSrc:   ?PainSc: 6   ? ? ?  ?  ?  ?  ?  ?  ? ?[name] ? ? ? ? ?

## 2021-05-18 LAB — SURGICAL PATHOLOGY

## 2021-05-25 ENCOUNTER — Encounter: Payer: Self-pay | Admitting: Surgery

## 2022-04-16 ENCOUNTER — Ambulatory Visit
Admission: RE | Admit: 2022-04-16 | Discharge: 2022-04-16 | Disposition: A | Discharge status: Home / Self Care | Payer: Medicare Other | Source: Ambulatory Visit | Attending: Internal Medicine | Admitting: Internal Medicine

## 2022-04-16 DIAGNOSIS — Z87891 Personal history of nicotine dependence: Secondary | ICD-10-CM | POA: Diagnosis present

## 2022-04-16 DIAGNOSIS — F17210 Nicotine dependence, cigarettes, uncomplicated: Secondary | ICD-10-CM | POA: Diagnosis present

## 2022-04-19 ENCOUNTER — Other Ambulatory Visit: Payer: Self-pay | Admitting: Acute Care

## 2022-04-19 DIAGNOSIS — Z87891 Personal history of nicotine dependence: Secondary | ICD-10-CM

## 2022-04-19 DIAGNOSIS — Z122 Encounter for screening for malignant neoplasm of respiratory organs: Secondary | ICD-10-CM

## 2022-04-19 DIAGNOSIS — F17210 Nicotine dependence, cigarettes, uncomplicated: Secondary | ICD-10-CM

## 2023-04-18 ENCOUNTER — Ambulatory Visit
Admission: RE | Admit: 2023-04-18 | Discharge: 2023-04-18 | Disposition: A | Discharge status: Home / Self Care | Payer: Medicare Other | Source: Ambulatory Visit | Attending: Internal Medicine | Admitting: Internal Medicine

## 2023-04-18 DIAGNOSIS — F17210 Nicotine dependence, cigarettes, uncomplicated: Secondary | ICD-10-CM | POA: Diagnosis present

## 2023-04-18 DIAGNOSIS — Z87891 Personal history of nicotine dependence: Secondary | ICD-10-CM | POA: Insufficient documentation

## 2023-04-18 DIAGNOSIS — Z122 Encounter for screening for malignant neoplasm of respiratory organs: Secondary | ICD-10-CM | POA: Diagnosis present

## 2023-05-20 ENCOUNTER — Other Ambulatory Visit: Payer: Self-pay

## 2023-05-20 DIAGNOSIS — Z87891 Personal history of nicotine dependence: Secondary | ICD-10-CM

## 2023-05-20 DIAGNOSIS — Z122 Encounter for screening for malignant neoplasm of respiratory organs: Secondary | ICD-10-CM

## 2023-05-20 DIAGNOSIS — F17210 Nicotine dependence, cigarettes, uncomplicated: Secondary | ICD-10-CM

## 2023-06-10 ENCOUNTER — Ambulatory Visit: Payer: Self-pay

## 2023-06-10 DIAGNOSIS — Z09 Encounter for follow-up examination after completed treatment for conditions other than malignant neoplasm: Secondary | ICD-10-CM | POA: Diagnosis not present

## 2023-06-10 DIAGNOSIS — K5730 Diverticulosis of large intestine without perforation or abscess without bleeding: Secondary | ICD-10-CM | POA: Diagnosis not present

## 2023-06-10 DIAGNOSIS — K641 Second degree hemorrhoids: Secondary | ICD-10-CM | POA: Diagnosis not present

## 2023-06-10 DIAGNOSIS — Z860101 Personal history of adenomatous and serrated colon polyps: Secondary | ICD-10-CM | POA: Diagnosis not present
